# Patient Record
Sex: Male | Born: 1960 | Race: White | Hispanic: No | Marital: Married | Smoking: Former smoker
Health system: Southern US, Community
[De-identification: ages and names within clinical notes are randomized; demographics above are authoritative.]

## PROBLEM LIST (undated history)

## (undated) DIAGNOSIS — M542 Cervicalgia: Secondary | ICD-10-CM

## (undated) DIAGNOSIS — R519 Headache, unspecified: Secondary | ICD-10-CM

## (undated) DIAGNOSIS — I10 Essential (primary) hypertension: Secondary | ICD-10-CM

## (undated) DIAGNOSIS — M109 Gout, unspecified: Secondary | ICD-10-CM

## (undated) DIAGNOSIS — R51 Headache: Secondary | ICD-10-CM

## (undated) DIAGNOSIS — G4730 Sleep apnea, unspecified: Secondary | ICD-10-CM

## (undated) DIAGNOSIS — D689 Coagulation defect, unspecified: Secondary | ICD-10-CM

## (undated) DIAGNOSIS — E079 Disorder of thyroid, unspecified: Secondary | ICD-10-CM

## (undated) DIAGNOSIS — G629 Polyneuropathy, unspecified: Secondary | ICD-10-CM

## (undated) HISTORY — DX: Coagulation defect, unspecified: D68.9

## (undated) HISTORY — DX: Headache, unspecified: R51.9

## (undated) HISTORY — DX: Disorder of thyroid, unspecified: E07.9

## (undated) HISTORY — DX: Cervicalgia: M54.2

## (undated) HISTORY — DX: Essential (primary) hypertension: I10

## (undated) HISTORY — DX: Gout, unspecified: M10.9

## (undated) HISTORY — DX: Polyneuropathy, unspecified: G62.9

## (undated) HISTORY — PX: ABDOMINAL SURGERY: SHX537

## (undated) HISTORY — PX: OTHER SURGICAL HISTORY: SHX169

## (undated) HISTORY — DX: Sleep apnea, unspecified: G47.30

## (undated) HISTORY — DX: Headache: R51

## (undated) HISTORY — PX: HERNIA REPAIR: SHX51

## (undated) HISTORY — PX: KNEE SURGERY: SHX244

---

## 2014-12-26 ENCOUNTER — Ambulatory Visit: Payer: BLUE CROSS/BLUE SHIELD | Admitting: Neurology

## 2015-01-20 ENCOUNTER — Encounter: Payer: Self-pay | Admitting: Neurology

## 2015-01-20 ENCOUNTER — Ambulatory Visit (INDEPENDENT_AMBULATORY_CARE_PROVIDER_SITE_OTHER): Payer: BLUE CROSS/BLUE SHIELD | Admitting: Neurology

## 2015-01-20 VITALS — BP 128/81 | HR 70 | Ht 74.0 in | Wt 276.8 lb

## 2015-01-20 DIAGNOSIS — R202 Paresthesia of skin: Secondary | ICD-10-CM | POA: Diagnosis not present

## 2015-01-20 DIAGNOSIS — M542 Cervicalgia: Secondary | ICD-10-CM

## 2015-01-20 DIAGNOSIS — M25519 Pain in unspecified shoulder: Secondary | ICD-10-CM

## 2015-01-20 DIAGNOSIS — G629 Polyneuropathy, unspecified: Secondary | ICD-10-CM | POA: Diagnosis not present

## 2015-01-20 MED ORDER — CYCLOBENZAPRINE HCL 5 MG PO TABS: 5.0000 mg | ORAL_TABLET | Freq: Three times a day (TID) | ORAL | Status: DC | PRN

## 2015-01-20 MED ORDER — GABAPENTIN 300 MG PO CAPS: 300.0000 mg | ORAL_CAPSULE | Freq: Three times a day (TID) | ORAL | Status: DC

## 2015-01-20 NOTE — Patient Instructions (Signed)
I had a long discussion with the patient and his wife regarding his chronic neck pain and right neck face and shoulder paresthesias as well as diagnosis of peripheral neuropathy. I recommend he try taking Flexeril 5 mg twice daily for one week and then increase to 3 times daily as tolerated for his muscular pain and start gabapentin 300 mg twice daily for one week and increase to 3 times daily as tolerated to help with his paresthesias. Check MRI scan of cervical spine to look for bulging disc and I have advised him to apply local heat to his neck on the right side and not to lift more than 50 pounds weight. We will also try to get his prior neurological records from Dr. Clent Ridges in [place] for his neuropathy workup he was advised to return for follow-up in 2 months or call earlier if necessary  Cervical Sprain A cervical sprain is an injury in the neck in which the strong, fibrous tissues (ligaments) that connect your neck bones stretch or tear. Cervical sprains can range from mild to severe. Severe cervical sprains can cause the neck vertebrae to be unstable. This can lead to damage of the spinal cord and can result in serious nervous system problems. The amount of time it takes for a cervical sprain to get better depends on the cause and extent of the injury. Most cervical sprains heal in 1 to 3 weeks. CAUSES  Severe cervical sprains may be caused by:   Contact sport injuries (such as from football, rugby, wrestling, hockey, auto racing, gymnastics, diving, martial arts, or boxing).   Motor vehicle collisions.   Whiplash injuries. This is an injury from a sudden forward and backward whipping movement of the head and neck.  Falls.  Mild cervical sprains may be caused by:   Being in an awkward position, such as while cradling a telephone between your ear and shoulder.   Sitting in a chair that does not offer proper support.   Working at a poorly Marketing executive station.   Looking up  or down for long periods of time.  SYMPTOMS   Pain, soreness, stiffness, or a burning sensation in the front, back, or sides of the neck. This discomfort may develop immediately after the injury or slowly, 24 hours or more after the injury.   Pain or tenderness directly in the middle of the back of the neck.   Shoulder or upper back pain.   Limited ability to move the neck.   Headache.   Dizziness.   Weakness, numbness, or tingling in the hands or arms.   Muscle spasms.   Difficulty swallowing or chewing.   Tenderness and swelling of the neck.  DIAGNOSIS  Most of the time your health care provider can diagnose a cervical sprain by taking your history and doing a physical exam. Your health care provider will ask about previous neck injuries and any known neck problems, such as arthritis in the neck. X-rays may be taken to find out if there are any other problems, such as with the bones of the neck. Other tests, such as a CT scan or MRI, may also be needed.  TREATMENT  Treatment depends on the severity of the cervical sprain. Mild sprains can be treated with rest, keeping the neck in place (immobilization), and pain medicines. Severe cervical sprains are immediately immobilized. Further treatment is done to help with pain, muscle spasms, and other symptoms and may include:  Medicines, such as pain relievers, numbing medicines, or  muscle relaxants.   Physical therapy. This may involve stretching exercises, strengthening exercises, and posture training. Exercises and improved posture can help stabilize the neck, strengthen muscles, and help stop symptoms from returning.  HOME CARE INSTRUCTIONS   Put ice on the injured area.   Put ice in a plastic bag.   Place a towel between your skin and the bag.   Leave the ice on for 15-20 minutes, 3-4 times a day.   If your injury was severe, you may have been given a cervical collar to wear. A cervical collar is a two-piece  collar designed to keep your neck from moving while it heals.  Do not remove the collar unless instructed by your health care provider.  If you have long hair, keep it outside of the collar.  Ask your health care provider before making any adjustments to your collar. Minor adjustments may be required over time to improve comfort and reduce pressure on your chin or on the back of your head.  Ifyou are allowed to remove the collar for cleaning or bathing, follow your health care provider's instructions on how to do so safely.  Keep your collar clean by wiping it with mild soap and water and drying it completely. If the collar you have been given includes removable pads, remove them every 1-2 days and hand wash them with soap and water. Allow them to air dry. They should be completely dry before you wear them in the collar.  If you are allowed to remove the collar for cleaning and bathing, wash and dry the skin of your neck. Check your skin for irritation or sores. If you see any, tell your health care provider.  Do not drive while wearing the collar.   Only take over-the-counter or prescription medicines for pain, discomfort, or fever as directed by your health care provider.   Keep all follow-up appointments as directed by your health care provider.   Keep all physical therapy appointments as directed by your health care provider.   Make any needed adjustments to your workstation to promote good posture.   Avoid positions and activities that make your symptoms worse.   Warm up and stretch before being active to help prevent problems.  SEEK MEDICAL CARE IF:   Your pain is not controlled with medicine.   You are unable to decrease your pain medicine over time as planned.   Your activity level is not improving as expected.  SEEK IMMEDIATE MEDICAL CARE IF:   You develop any bleeding.  You develop stomach upset.  You have signs of an allergic reaction to your medicine.    Your symptoms get worse.   You develop new, unexplained symptoms.   You have numbness, tingling, weakness, or paralysis in any part of your body.  MAKE SURE YOU:   Understand these instructions.  Will watch your condition.  Will get help right away if you are not doing well or get worse.   This information is not intended to replace advice given to you by your health care provider. Make sure you discuss any questions you have with your health care provider.   Document Released: 01/24/2007 Document Revised: 04/03/2013 Document Reviewed: 10/04/2012 Elsevier Interactive Patient Education [employer]

## 2015-01-20 NOTE — Progress Notes (Signed)
[facility] [address] [place]. [place] [address] [phone]       OFFICE CONSULT NOTE  Mr. Scott Preston Date of Birth:  1960/12/23 Medical Record Number:  [id number]   Referring MD:  Fara Chute  Reason for Referral:  Neck pain and numbness  HPI: Scott Preston is a pleasant 54 year old Caucasian male who is referred for evaluation for bowel symptoms. He states his notice neck pain for the last 1 year or so. It was initially intermittent but for the last 2 months or so he is had constant pain. He describes this as a muscle tightening pain on the right side which seems to be increased with physical exertion and moving his neck to the right or bending it to the right. He was seen by his primary physician Dr. Fara Chute who initially prescribed non-steroidal anti-inflammatory agents with only partial relief. He was also prescribed Flexeril but he took it only at night as it made him sleepy. For the last 2 months is also noticed tingling and numbness sensation involving the right shoulder starting there and spreading to the right infraclavicular region as well as lower part of the neck and at times extending into his right cheek. He denies any sharp shooting pain from his spine going down or into his arms or shoulders. He had some x-rays done of the cervical spine which had not have available for my review but as per the patient showed mild arthritic changes. He has tried local application which have not helped. Currently his been taking Mobic for the last 2 months without help. He has a prescription for Norco but he does not take it as it makes him nauseous and jittery. He has not been on medications like gabapentin or Topamax. Patient does have chronic tingling and numbness in his feet and was seen by neurologist Dr. Shary Decamp in [place] a year ago and had no conduction EMG study which apparently showed neuropathy. I do not have any of those records available for my  review today and patient is not sure if he had neuropathy workup done. He has no history of diabetes, heavy alcohol intake or exposure to toxic agents. 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 He has not limited his heavy lifting despite having significant neck pain and paresthesias. He does have history of bulging disc in his low back a few years ago which was treated conservatively.  ROS:   14 system review of systems is positive for  neck pain, numbness, headache, tingling, joint pain, restless legs and all other systems negative PMH:  Past Medical History  Diagnosis Date  . Neuropathy (HCC)     feet  . Headache   . Neck pain   . Hypertension   . Thyroid disease     Social History:  Social History   Social History  . Marital Status: Married    Spouse Name: N/A  . Number of Children: N/A  . Years of Education: N/A   Occupational History  . Not on file.   Social History Main Topics  . Smoking status: Never Smoker   . Smokeless tobacco: Current User    Types: Chew  . Alcohol Use: Yes     Comment: occasionally beer  . Drug Use: Not on file  . Sexual Activity: Not on file   Other Topics Concern  . Not on file   Social History Narrative  . No narrative on  file    Medications:   No current outpatient prescriptions on file prior to visit.   No current facility-administered medications on file prior to visit.    Allergies:   Allergies  Allergen Reactions  . Penicillins Anaphylaxis    Tongue close up and swelling    Physical Exam General: well developed, well nourished, seated, in no evident distress Head: head normocephalic and atraumatic.   Neck: supple with no carotid or supraclavicular bruits. Tenderness over the lower paraspinal neck muscles on the right with slight restriction of neck flexion to the right and rotation Cardiovascular: regular rate and rhythm, no murmurs Musculoskeletal: no deformity Skin:  no  rash/petichiae Vascular:  Normal pulses all extremities  Neurologic Exam Mental Status: Awake and fully alert. Oriented to place and time. Recent and remote memory intact. Attention span, concentration and fund of knowledge appropriate. Mood and affect appropriate.  Cranial Nerves: Fundoscopic exam reveals sharp disc margins. Pupils equal, briskly reactive to light. Extraocular movements full without nystagmus. Visual fields full to confrontation. Hearing intact. Facial sensation intact. Face, tongue, palate moves normally and symmetrically.  Motor: Normal bulk and tone. Normal strength in all tested extremity muscles. Sensory.: Diminished touch , pinprick sensation over toes bilaterally Diminished vibration sensation over toes bilaterally. Position sense is also slightly impaired over toes bilaterally Coordination: Rapid alternating movements normal in all extremities. Finger-to-nose and heel-to-shin performed accurately bilaterally. Gait and Station: Arises from chair without difficulty. Stance is normal. Gait demonstrates normal stride length and balance . Able to heel, toe and tandem walk without difficulty.  Reflexes: 1+ and symmetric including ankle jerks. Toes downgoing.       ASSESSMENT: 54 year old Caucasian male with one year history of posterior neck pain likely musculoskeletal from degenerative cervical spine disease as well as right face and shoulder paresthesias of unclear etiology. Chronic Lower extremity paresthesias from peripheral neuropathy of uncertain etiology.    PLAN: I had a long discussion with the patient and his wife regarding his chronic neck pain and right neck face and shoulder paresthesias as well as diagnosis of peripheral neuropathy. I recommend he try taking Flexeril 5 mg twice daily for one week and then increase to 3 times daily as tolerated for his muscular pain and start gabapentin 300 mg twice daily for one week and increase to 3 times daily as tolerated to  help with his paresthesias. Check MRI scan of cervical spine to look for bulging disc and I have advised him to apply local heat to his neck on the right side and not to lift more than 50 pounds weight. We will also try to get his prior neurological records from Dr. Clent Ridges in [place] for his neuropathy workup. Greater than 50% time during this 45 minute consultation was spent on counseling and coordination of care he was advised to return for follow-up in 2 months or call earlier if necessary  Delia Heady, MD Note: This document was prepared with digital dictation and possible smart phrase technology. Any transcriptional errors that result from this process are unintentional.

## 2015-01-22 ENCOUNTER — Telehealth: Payer: Self-pay | Admitting: *Deleted

## 2015-01-22 NOTE — Telephone Encounter (Signed)
Received records Dr Pearlean BrownieSethi [facility],Dr Clent RidgesWalsh to [place] 01/22/15.

## 2015-02-12 ENCOUNTER — Other Ambulatory Visit: Payer: Self-pay

## 2015-02-12 ENCOUNTER — Other Ambulatory Visit: Payer: Self-pay | Admitting: *Deleted

## 2015-02-12 ENCOUNTER — Other Ambulatory Visit: Payer: Self-pay | Admitting: Neurology

## 2015-02-12 ENCOUNTER — Ambulatory Visit (INDEPENDENT_AMBULATORY_CARE_PROVIDER_SITE_OTHER): Payer: BLUE CROSS/BLUE SHIELD

## 2015-02-12 ENCOUNTER — Ambulatory Visit
Admission: RE | Admit: 2015-02-12 | Discharge: 2015-02-12 | Disposition: A | Discharge status: Home / Self Care | Payer: BLUE CROSS/BLUE SHIELD | Source: Ambulatory Visit | Attending: Neurology | Admitting: Neurology

## 2015-02-12 DIAGNOSIS — R202 Paresthesia of skin: Secondary | ICD-10-CM

## 2015-02-12 DIAGNOSIS — M549 Dorsalgia, unspecified: Principal | ICD-10-CM

## 2015-02-12 DIAGNOSIS — M25519 Pain in unspecified shoulder: Secondary | ICD-10-CM | POA: Diagnosis not present

## 2015-02-12 DIAGNOSIS — G8929 Other chronic pain: Principal | ICD-10-CM

## 2015-02-12 DIAGNOSIS — M542 Cervicalgia: Secondary | ICD-10-CM

## 2015-02-13 NOTE — Progress Notes (Signed)
Patient schedule to have MRI on 02-12-15. [facility] 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 Sue Lushndrea who does MRi scheduling stating pt needs order. The order was put in by nurse for Dr.Sethi to follow up with.

## 2015-02-18 ENCOUNTER — Other Ambulatory Visit: Payer: Self-pay | Admitting: Neurology

## 2015-02-18 DIAGNOSIS — R202 Paresthesia of skin: Secondary | ICD-10-CM

## 2015-02-18 DIAGNOSIS — M542 Cervicalgia: Principal | ICD-10-CM

## 2015-02-18 DIAGNOSIS — M5010 Cervical disc disorder with radiculopathy, unspecified cervical region: Secondary | ICD-10-CM

## 2015-02-18 DIAGNOSIS — M25519 Pain in unspecified shoulder: Secondary | ICD-10-CM

## 2015-02-18 MED ORDER — GABAPENTIN 300 MG PO CAPS: 600.0000 mg | ORAL_CAPSULE | Freq: Three times a day (TID) | ORAL | Status: DC

## 2015-03-20 ENCOUNTER — Encounter: Payer: Self-pay | Admitting: Neurology

## 2015-03-20 ENCOUNTER — Ambulatory Visit (INDEPENDENT_AMBULATORY_CARE_PROVIDER_SITE_OTHER): Payer: BLUE CROSS/BLUE SHIELD | Admitting: Neurology

## 2015-03-20 VITALS — BP 136/82 | HR 77 | Ht 74.0 in | Wt 283.2 lb

## 2015-03-20 DIAGNOSIS — M542 Cervicalgia: Secondary | ICD-10-CM | POA: Diagnosis not present

## 2015-03-20 NOTE — Progress Notes (Signed)
[facility] [address] [place]. [place] [address] [phone]       OFFICE FOLLOW UP VISIT NOTE  Mr. Scott Preston Date of Birth:  30-May-1960 Medical Record Number:  [id number]   Referring MD:  Fara Chute  Reason for Referral:  Neck pain and numbness  HPI: Scott Preston is a pleasant 54 year old Caucasian male who is referred for evaluation for bowel symptoms. He states his notice neck pain for the last 1 year or so. It was initially intermittent but for the last 2 months or so he is had constant pain. He describes this as a muscle tightening pain on the right side which seems to be increased with physical exertion and moving his neck to the right or bending it to the right. He was seen by his primary physician Dr. Fara Chute who initially prescribed non-steroidal anti-inflammatory agents with only partial relief. He was also prescribed Flexeril but he took it only at night as it made him sleepy. For the last 2 months is also noticed tingling and numbness sensation involving the right shoulder starting there and spreading to the right infraclavicular region as well as lower part of the neck and at times extending into his right cheek. He denies any sharp shooting pain from his spine going down or into his arms or shoulders. He had some x-rays done of the cervical spine which had not have available for my review but as per the patient showed mild arthritic changes. He has tried local application which have not helped. Currently his been taking Mobic for the last 2 months without help. He has a prescription for Norco but he does not take it as it makes him nauseous and jittery. He has not been on medications like gabapentin or Topamax. Patient does have chronic tingling and numbness in his feet and was seen by neurologist Dr. Shary Decamp in [place] a year ago and had no conduction EMG study which apparently showed neuropathy. I do not have any of those records available  for my review today and patient is not sure if he had neuropathy workup done. He has no history of diabetes, heavy alcohol intake or exposure to toxic agents. 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 He has not limited his heavy lifting despite having significant neck pain and paresthesias. He does have history of bulging disc in his low back a few years ago which was treated conservatively. Update 03/20/2015 ; He returns for follow-up after last visit 2 months ago. His history of increasing the gabapentin to 600 mg twice daily does not seem to help in fact regarding side effects and feels his gained weight and has swelling in his legs. He is also taking Flexeril 5 mg twice daily but that doesn't seem to be helped either but is tolerating it well without side effects. He still has pain on the right side of the neck and other times when he has trouble moving his neck to the right side. He describes mostly a muscle pulling tightness pain but at times he also has numbness on the right side. He did undergo MRI scan of the cervical spine which I have personally reviewed on 02/14/15 which shows slight anterior listhesis of C3 upon C4 with moderate uncovertebral spurring and broad-based disc bulging with neural foraminal narrowing right greater than left with possible right C4 nerve root compression. I have referred him to Dr. Venetia Maxon but the appointment has not  yet happened. ROS:   14 system review of systems is positive for  neck pain, numbness, cough, leg swelling tingling, joint pain, restless legs and all other systems negative PMH:  Past Medical History  Diagnosis Date  . Neuropathy (HCC)     feet  . Headache   . Neck pain   . Hypertension   . Thyroid disease     Social History:  Social History   Social History  . Marital Status: Married    Spouse Name: N/A  . Number of Children: N/A  . Years of Education: N/A   Occupational History  . Not on file.    Social History Main Topics  . Smoking status: Never Smoker   . Smokeless tobacco: Current User    Types: Chew  . Alcohol Use: Yes     Comment: occasionally beer  . Drug Use: Not on file  . Sexual Activity: Not on file   Other Topics Concern  . Not on file   Social History Narrative    Medications:   Current Outpatient Prescriptions on File Prior to Visit  Medication Sig Dispense Refill  . aspirin 81 MG tablet Take 81 mg by mouth daily.    . cyclobenzaprine (FLEXERIL) 5 MG tablet Take 1 tablet (5 mg total) by mouth every 8 (eight) hours as needed for muscle spasms. 30 tablet 1  . gabapentin (NEURONTIN) 300 MG capsule Take 2 capsules (600 mg total) by mouth 3 (three) times daily. 90 capsule 11  . levothyroxine (SYNTHROID, LEVOTHROID) 125 MCG tablet     . lisinopril (PRINIVIL,ZESTRIL) 20 MG tablet Take 20 mg by mouth daily.    . traZODone (DESYREL) 100 MG tablet      No current facility-administered medications on file prior to visit.    Allergies:   Allergies  Allergen Reactions  . Penicillins Anaphylaxis    Tongue close up and swelling    Physical Exam General: well developed, well nourished, seated, in no evident distress Head: head normocephalic and atraumatic.   Neck: supple with no carotid or supraclavicular bruits. Tenderness over the lower paraspinal neck muscles on the right with slight restriction of neck flexion to the right and rotation Cardiovascular: regular rate and rhythm, no murmurs Musculoskeletal: no deformity Skin:  no rash/petichiae Vascular:  Normal pulses all extremities  Neurologic Exam Mental Status: Awake and fully alert. Oriented to place and time. Recent and remote memory intact. Attention span, concentration and fund of knowledge appropriate. Mood and affect appropriate.  Cranial Nerves: Fundoscopic exam  c  . Pupils equal, briskly reactive to light. Extraocular movements full without nystagmus. Visual fields full to confrontation. Hearing  intact. Facial sensation intact. Face, tongue, palate moves normally and symmetrically.  Motor: Normal bulk and tone. Normal strength in all tested extremity muscles. Sensory.: Diminished touch , pinprick sensation over toes bilaterally Diminished vibration sensation over toes bilaterally. Position sense is also slightly impaired over toes bilaterally Coordination: Rapid alternating movements normal in all extremities. Finger-to-nose and heel-to-shin performed accurately bilaterally. Gait and Station: Arises from chair without difficulty. Stance is normal. Gait demonstrates normal stride length and balance . Able to heel, toe and tandem walk without difficulty.  Reflexes: 1+ and symmetric including ankle jerks. Toes downgoing.       ASSESSMENT: 54 year old Caucasian male with one year history of posterior neck pain likely musculoskeletal from degenerative cervical spine disease as well as  Possible right C 4 radiculopathy. Chronic Lower extremity paresthesias from peripheral neuropathy of uncertain etiology.  PLAN: I had a long discussion the patient and his wife regarding his persistent right sided posterior neck and shoulder pain which has mixed musculoskeletal and C4 radicular pain features. He has not responded to gabapentin and is having side effects and hence recommend we discontinue it he has a referral to see Dr. Venetia MaxonStern from neurosurgery to consider steroid injection versus foraminotomy. I have given him neck stretching exercises and advised him to use local heat and to try increasing Flexeril to 10 mg twice daily if tolerated. He will call me face neuropathic symptoms from peripheral neuropathy get worse after stopping the gabapentin to try Topamax for that he, later. He will return in follow-up in 6 months or call earlier if necessary  Scott HeadyPramod Sethi, MD Note: This document was prepared with digital dictation and possible smart phrase technology. Any transcriptional errors that result  from this process are unintentional.

## 2015-03-20 NOTE — Patient Instructions (Signed)
I had a long discussion the patient and his wife regarding his persistent right sided posterior neck and shoulder pain which has mixed musculoskeletal and C4 radicular pain features. He has not responded to gabapentin and is having side effects and hence recommend we discontinue it he has a referral to see Dr. Venetia MaxonStern from neurosurgery to consider steroid injection versus foraminotomy. I have given him neck stretching exercises and advised him to use local heat and to try increasing Flexeril to 10 mg twice daily if tolerated. He will call me face neuropathic symptoms from peripheral neuropathy get worse after stopping the gabapentin to try Topamax for that he, later. He will return in follow-up in 6 months or call earlier if necessary Cervical Radiculopathy Cervical radiculopathy happens when a nerve in the neck (cervical nerve) is pinched or bruised. This condition can develop because of an injury or as part of the normal aging process. Pressure on the cervical nerves can cause pain or numbness that runs from the neck all the way down into the arm and fingers. Usually, this condition gets better with rest. Treatment may be needed if the condition does not improve.  CAUSES This condition may be caused by:  Injury.  Slipped (herniated) disk.  Muscle tightness in the neck because of overuse.  Arthritis.  Breakdown or degeneration in the bones and joints of the spine (spondylosis) due to aging.  Bone spurs that may develop near the cervical nerves. SYMPTOMS Symptoms of this condition include:  Pain that runs from the neck to the arm and hand. The pain can be severe or irritating. It may be worse when the neck is moved.  Numbness or weakness in the affected arm and hand. DIAGNOSIS This condition may be diagnosed based on symptoms, medical history, and a physical exam. You may also have tests, including:  X-rays.  CT scan.  MRI.  Electromyogram (EMG).  Nerve conduction  tests. TREATMENT In many cases, treatment is not needed for this condition. With rest, the condition usually gets better over time. If treatment is needed, options may include:  Wearing a soft neck collar for short periods of time.  Physical therapy to strengthen your neck muscles.  Medicines, such as NSAIDs, oral corticosteroids, or spinal injections.  Surgery. This may be needed if other treatments do not help. Various types of surgery may be done depending on the cause of your problems. HOME CARE INSTRUCTIONS Managing Pain  Take over-the-counter and prescription medicines only as told by your health care provider.  If directed, apply ice to the affected area.  Put ice in a plastic bag.  Place a towel between your skin and the bag.  Leave the ice on for 20 minutes, 2-3 times per day.  If ice does not help, you can try using heat. Take a warm shower or warm bath, or use a heat pack as told by your health care provider.  Try a gentle neck and shoulder massage to help relieve symptoms. Activity  Rest as needed. Follow instructions from your health care provider about any restrictions on activities.  Do stretching and strengthening exercises as told by your health care provider or physical therapist. General Instructions  If you were given a soft collar, wear it as told by your health care provider.  Use a flat pillow when you sleep.  Keep all follow-up visits as told by your health care provider. This is important. SEEK MEDICAL CARE IF:  Your condition does not improve with treatment. SEEK IMMEDIATE MEDICAL  CARE IF:  Your pain gets much worse and cannot be controlled with medicines.  You have weakness or numbness in your hand, arm, face, or leg.  You have a high fever.  You have a stiff, rigid neck.  You lose control of your bowels or your bladder (have incontinence).  You have trouble with walking, balance, or speaking.   This information is not intended to  replace advice given to you by your health care provider. Make sure you discuss any questions you have with your health care provider.   Document Released: 12/22/2000 Document Revised: 12/18/2014 Document Reviewed: 05/23/2014 Elsevier Interactive Patient Education [employer]

## 2015-03-26 ENCOUNTER — Other Ambulatory Visit: Payer: Self-pay | Admitting: Neurology

## 2015-03-26 NOTE — Telephone Encounter (Signed)
Last OV note says: try increasing Flexeril to 10 mg twice daily if tolerated

## 2015-04-13 HISTORY — PX: SPINAL FUSION: SHX223

## 2015-09-18 ENCOUNTER — Ambulatory Visit: Payer: BLUE CROSS/BLUE SHIELD | Admitting: Neurology

## 2015-10-23 DIAGNOSIS — R739 Hyperglycemia, unspecified: Secondary | ICD-10-CM | POA: Diagnosis not present

## 2015-10-23 DIAGNOSIS — I10 Essential (primary) hypertension: Secondary | ICD-10-CM | POA: Diagnosis not present

## 2015-10-23 DIAGNOSIS — E039 Hypothyroidism, unspecified: Secondary | ICD-10-CM | POA: Diagnosis not present

## 2016-03-13 IMAGING — CR DG ORBITS FOR FOREIGN BODY
2 series · 2 of 2 positions shown · non-contrast
Comparison: None.

CLINICAL DATA: Metal working/exposure; clearance prior to MRI

EXAM:
ORBITS FOR FOREIGN BODY - 2 VIEW

[w orbit pa (1 of 2)]
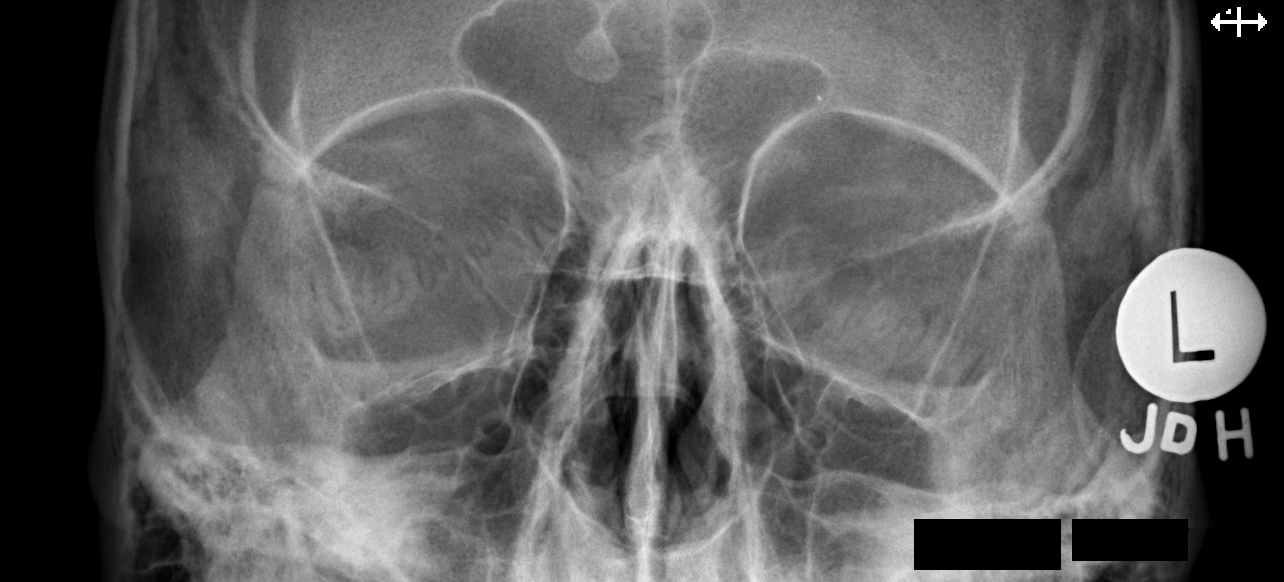

[w orbit pa (2 of 2)]
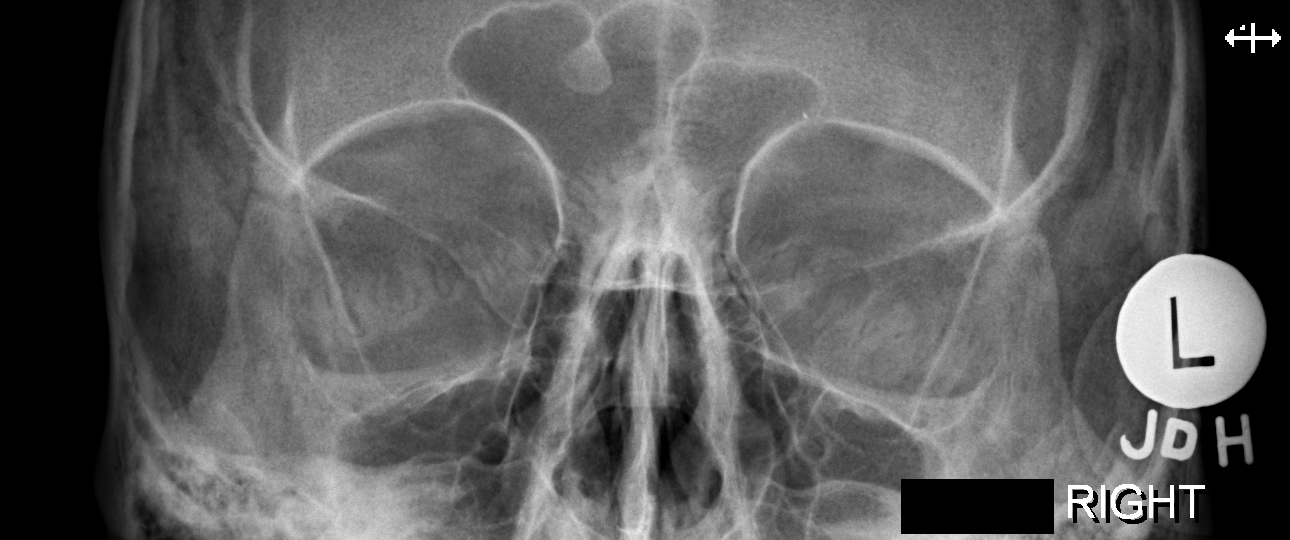

[2 of 2 positions shown; findings below may reference images not displayed]

FINDINGS: There is no evidence of metallic foreign body within the orbits. No
significant bone abnormality identified.
IMPRESSION: No evidence of metallic foreign body within the orbits.

## 2016-05-03 DIAGNOSIS — I10 Essential (primary) hypertension: Secondary | ICD-10-CM | POA: Diagnosis not present

## 2016-05-03 DIAGNOSIS — E039 Hypothyroidism, unspecified: Secondary | ICD-10-CM | POA: Diagnosis not present

## 2016-05-03 DIAGNOSIS — M545 Low back pain: Secondary | ICD-10-CM | POA: Diagnosis not present

## 2016-05-03 DIAGNOSIS — M2578 Osteophyte, vertebrae: Secondary | ICD-10-CM | POA: Diagnosis not present

## 2016-05-03 DIAGNOSIS — M542 Cervicalgia: Secondary | ICD-10-CM | POA: Diagnosis not present

## 2016-05-25 DIAGNOSIS — J209 Acute bronchitis, unspecified: Secondary | ICD-10-CM | POA: Diagnosis not present

## 2016-05-25 DIAGNOSIS — Z6841 Body Mass Index (BMI) 40.0 and over, adult: Secondary | ICD-10-CM | POA: Diagnosis not present

## 2016-05-25 DIAGNOSIS — J069 Acute upper respiratory infection, unspecified: Secondary | ICD-10-CM | POA: Diagnosis not present

## 2016-07-29 DIAGNOSIS — M19011 Primary osteoarthritis, right shoulder: Secondary | ICD-10-CM | POA: Diagnosis not present

## 2016-07-29 DIAGNOSIS — M542 Cervicalgia: Secondary | ICD-10-CM | POA: Diagnosis not present

## 2016-07-29 DIAGNOSIS — M19012 Primary osteoarthritis, left shoulder: Secondary | ICD-10-CM | POA: Diagnosis not present

## 2016-07-29 DIAGNOSIS — I82401 Acute embolism and thrombosis of unspecified deep veins of right lower extremity: Secondary | ICD-10-CM | POA: Diagnosis not present

## 2016-07-29 DIAGNOSIS — M799 Soft tissue disorder, unspecified: Secondary | ICD-10-CM | POA: Diagnosis not present

## 2016-07-29 DIAGNOSIS — I10 Essential (primary) hypertension: Secondary | ICD-10-CM | POA: Diagnosis not present

## 2016-07-29 DIAGNOSIS — E039 Hypothyroidism, unspecified: Secondary | ICD-10-CM | POA: Diagnosis not present

## 2016-07-29 DIAGNOSIS — Z23 Encounter for immunization: Secondary | ICD-10-CM | POA: Diagnosis not present

## 2016-07-29 DIAGNOSIS — Z13220 Encounter for screening for lipoid disorders: Secondary | ICD-10-CM | POA: Diagnosis not present

## 2016-09-08 DIAGNOSIS — R072 Precordial pain: Secondary | ICD-10-CM | POA: Diagnosis not present

## 2016-09-08 DIAGNOSIS — R0600 Dyspnea, unspecified: Secondary | ICD-10-CM | POA: Diagnosis not present

## 2016-09-08 DIAGNOSIS — I824Z1 Acute embolism and thrombosis of unspecified deep veins of right distal lower extremity: Secondary | ICD-10-CM | POA: Diagnosis not present

## 2016-09-08 DIAGNOSIS — M722 Plantar fascial fibromatosis: Secondary | ICD-10-CM | POA: Diagnosis not present

## 2016-09-09 DIAGNOSIS — R0602 Shortness of breath: Secondary | ICD-10-CM | POA: Diagnosis not present

## 2016-09-09 DIAGNOSIS — R079 Chest pain, unspecified: Secondary | ICD-10-CM | POA: Diagnosis not present

## 2016-10-14 DIAGNOSIS — I824Z1 Acute embolism and thrombosis of unspecified deep veins of right distal lower extremity: Secondary | ICD-10-CM | POA: Diagnosis not present

## 2016-10-14 DIAGNOSIS — Z6841 Body Mass Index (BMI) 40.0 and over, adult: Secondary | ICD-10-CM | POA: Diagnosis not present

## 2016-10-18 DIAGNOSIS — M722 Plantar fascial fibromatosis: Secondary | ICD-10-CM | POA: Diagnosis not present

## 2016-11-29 DIAGNOSIS — M545 Low back pain: Secondary | ICD-10-CM | POA: Diagnosis not present

## 2016-11-29 DIAGNOSIS — M542 Cervicalgia: Secondary | ICD-10-CM | POA: Diagnosis not present

## 2016-11-29 DIAGNOSIS — I10 Essential (primary) hypertension: Secondary | ICD-10-CM | POA: Diagnosis not present

## 2016-11-29 DIAGNOSIS — E039 Hypothyroidism, unspecified: Secondary | ICD-10-CM | POA: Diagnosis not present

## 2016-11-29 DIAGNOSIS — Z23 Encounter for immunization: Secondary | ICD-10-CM | POA: Diagnosis not present

## 2016-12-17 DIAGNOSIS — M19011 Primary osteoarthritis, right shoulder: Secondary | ICD-10-CM | POA: Diagnosis not present

## 2016-12-17 DIAGNOSIS — Z1159 Encounter for screening for other viral diseases: Secondary | ICD-10-CM | POA: Diagnosis not present

## 2016-12-17 DIAGNOSIS — M75101 Unspecified rotator cuff tear or rupture of right shoulder, not specified as traumatic: Secondary | ICD-10-CM | POA: Diagnosis not present

## 2016-12-17 DIAGNOSIS — M7551 Bursitis of right shoulder: Secondary | ICD-10-CM | POA: Diagnosis not present

## 2016-12-17 DIAGNOSIS — M795 Residual foreign body in soft tissue: Secondary | ICD-10-CM | POA: Diagnosis not present

## 2017-01-04 DIAGNOSIS — M722 Plantar fascial fibromatosis: Secondary | ICD-10-CM | POA: Diagnosis not present

## 2017-01-04 DIAGNOSIS — M545 Low back pain: Secondary | ICD-10-CM | POA: Diagnosis not present

## 2017-01-04 DIAGNOSIS — E669 Obesity, unspecified: Secondary | ICD-10-CM | POA: Diagnosis not present

## 2017-01-04 DIAGNOSIS — G629 Polyneuropathy, unspecified: Secondary | ICD-10-CM | POA: Diagnosis not present

## 2017-01-20 DIAGNOSIS — I82491 Acute embolism and thrombosis of other specified deep vein of right lower extremity: Secondary | ICD-10-CM | POA: Diagnosis not present

## 2017-01-20 DIAGNOSIS — F17220 Nicotine dependence, chewing tobacco, uncomplicated: Secondary | ICD-10-CM | POA: Diagnosis not present

## 2017-01-20 DIAGNOSIS — I10 Essential (primary) hypertension: Secondary | ICD-10-CM | POA: Diagnosis not present

## 2017-01-20 DIAGNOSIS — I82442 Acute embolism and thrombosis of left tibial vein: Secondary | ICD-10-CM | POA: Diagnosis not present

## 2017-01-20 DIAGNOSIS — M7989 Other specified soft tissue disorders: Secondary | ICD-10-CM | POA: Diagnosis not present

## 2017-01-20 DIAGNOSIS — R0600 Dyspnea, unspecified: Secondary | ICD-10-CM | POA: Diagnosis not present

## 2017-01-20 DIAGNOSIS — I2699 Other pulmonary embolism without acute cor pulmonale: Secondary | ICD-10-CM | POA: Diagnosis not present

## 2017-01-21 DIAGNOSIS — F17220 Nicotine dependence, chewing tobacco, uncomplicated: Secondary | ICD-10-CM | POA: Diagnosis not present

## 2017-01-21 DIAGNOSIS — I10 Essential (primary) hypertension: Secondary | ICD-10-CM | POA: Diagnosis not present

## 2017-01-21 DIAGNOSIS — I2699 Other pulmonary embolism without acute cor pulmonale: Secondary | ICD-10-CM | POA: Diagnosis not present

## 2017-02-01 DIAGNOSIS — I10 Essential (primary) hypertension: Secondary | ICD-10-CM | POA: Diagnosis not present

## 2017-02-01 DIAGNOSIS — E039 Hypothyroidism, unspecified: Secondary | ICD-10-CM | POA: Diagnosis not present

## 2017-02-01 DIAGNOSIS — Z981 Arthrodesis status: Secondary | ICD-10-CM | POA: Diagnosis not present

## 2017-02-01 DIAGNOSIS — G629 Polyneuropathy, unspecified: Secondary | ICD-10-CM | POA: Diagnosis not present

## 2017-02-01 DIAGNOSIS — G8929 Other chronic pain: Secondary | ICD-10-CM | POA: Diagnosis not present

## 2017-02-01 DIAGNOSIS — M545 Low back pain: Secondary | ICD-10-CM | POA: Diagnosis not present

## 2017-02-01 DIAGNOSIS — I2699 Other pulmonary embolism without acute cor pulmonale: Secondary | ICD-10-CM | POA: Diagnosis not present

## 2017-02-01 DIAGNOSIS — M19011 Primary osteoarthritis, right shoulder: Secondary | ICD-10-CM | POA: Diagnosis not present

## 2017-02-01 DIAGNOSIS — R202 Paresthesia of skin: Secondary | ICD-10-CM | POA: Diagnosis not present

## 2017-02-01 DIAGNOSIS — M25511 Pain in right shoulder: Secondary | ICD-10-CM | POA: Diagnosis not present

## 2017-02-01 DIAGNOSIS — E668 Other obesity: Secondary | ICD-10-CM | POA: Diagnosis not present

## 2017-02-01 DIAGNOSIS — G40909 Epilepsy, unspecified, not intractable, without status epilepticus: Secondary | ICD-10-CM | POA: Diagnosis not present

## 2017-02-01 DIAGNOSIS — Z6841 Body Mass Index (BMI) 40.0 and over, adult: Secondary | ICD-10-CM | POA: Diagnosis not present

## 2017-02-01 DIAGNOSIS — E669 Obesity, unspecified: Secondary | ICD-10-CM | POA: Diagnosis not present

## 2017-02-01 DIAGNOSIS — M5412 Radiculopathy, cervical region: Secondary | ICD-10-CM | POA: Diagnosis not present

## 2017-02-01 DIAGNOSIS — M542 Cervicalgia: Secondary | ICD-10-CM | POA: Diagnosis not present

## 2017-02-01 DIAGNOSIS — I82541 Chronic embolism and thrombosis of right tibial vein: Secondary | ICD-10-CM | POA: Diagnosis not present

## 2017-02-09 DIAGNOSIS — N281 Cyst of kidney, acquired: Secondary | ICD-10-CM | POA: Diagnosis not present

## 2017-02-16 DIAGNOSIS — O2230 Deep phlebothrombosis in pregnancy, unspecified trimester: Secondary | ICD-10-CM | POA: Diagnosis not present

## 2017-02-16 DIAGNOSIS — Z6841 Body Mass Index (BMI) 40.0 and over, adult: Secondary | ICD-10-CM | POA: Diagnosis not present

## 2017-02-16 DIAGNOSIS — I82409 Acute embolism and thrombosis of unspecified deep veins of unspecified lower extremity: Secondary | ICD-10-CM | POA: Diagnosis not present

## 2017-02-16 DIAGNOSIS — I2609 Other pulmonary embolism with acute cor pulmonale: Secondary | ICD-10-CM | POA: Diagnosis not present

## 2017-02-18 DIAGNOSIS — R202 Paresthesia of skin: Secondary | ICD-10-CM | POA: Diagnosis not present

## 2017-02-18 DIAGNOSIS — I2609 Other pulmonary embolism with acute cor pulmonale: Secondary | ICD-10-CM | POA: Diagnosis not present

## 2017-02-18 DIAGNOSIS — M542 Cervicalgia: Secondary | ICD-10-CM | POA: Diagnosis not present

## 2017-02-18 DIAGNOSIS — I82409 Acute embolism and thrombosis of unspecified deep veins of unspecified lower extremity: Secondary | ICD-10-CM | POA: Diagnosis not present

## 2017-02-18 DIAGNOSIS — M25519 Pain in unspecified shoulder: Secondary | ICD-10-CM | POA: Diagnosis not present

## 2017-02-18 DIAGNOSIS — G629 Polyneuropathy, unspecified: Secondary | ICD-10-CM | POA: Diagnosis not present

## 2017-02-20 DIAGNOSIS — Z6841 Body Mass Index (BMI) 40.0 and over, adult: Secondary | ICD-10-CM | POA: Diagnosis not present

## 2017-02-20 DIAGNOSIS — I2699 Other pulmonary embolism without acute cor pulmonale: Secondary | ICD-10-CM | POA: Diagnosis not present

## 2017-02-20 DIAGNOSIS — I824Z1 Acute embolism and thrombosis of unspecified deep veins of right distal lower extremity: Secondary | ICD-10-CM | POA: Diagnosis not present

## 2017-03-22 DIAGNOSIS — Z1389 Encounter for screening for other disorder: Secondary | ICD-10-CM | POA: Diagnosis not present

## 2017-03-22 DIAGNOSIS — I10 Essential (primary) hypertension: Secondary | ICD-10-CM | POA: Diagnosis not present

## 2017-03-22 DIAGNOSIS — I824Z1 Acute embolism and thrombosis of unspecified deep veins of right distal lower extremity: Secondary | ICD-10-CM | POA: Diagnosis not present

## 2017-03-22 DIAGNOSIS — G2581 Restless legs syndrome: Secondary | ICD-10-CM | POA: Diagnosis not present

## 2017-03-22 DIAGNOSIS — E039 Hypothyroidism, unspecified: Secondary | ICD-10-CM | POA: Diagnosis not present

## 2017-04-03 DIAGNOSIS — E669 Obesity, unspecified: Secondary | ICD-10-CM | POA: Diagnosis not present

## 2017-04-03 DIAGNOSIS — E039 Hypothyroidism, unspecified: Secondary | ICD-10-CM | POA: Diagnosis not present

## 2017-04-03 DIAGNOSIS — R05 Cough: Secondary | ICD-10-CM | POA: Diagnosis not present

## 2017-04-03 DIAGNOSIS — I10 Essential (primary) hypertension: Secondary | ICD-10-CM | POA: Diagnosis not present

## 2017-04-03 DIAGNOSIS — J189 Pneumonia, unspecified organism: Secondary | ICD-10-CM | POA: Diagnosis not present

## 2017-04-04 DIAGNOSIS — J189 Pneumonia, unspecified organism: Secondary | ICD-10-CM | POA: Diagnosis not present

## 2017-04-04 DIAGNOSIS — E039 Hypothyroidism, unspecified: Secondary | ICD-10-CM | POA: Diagnosis not present

## 2017-04-04 DIAGNOSIS — I10 Essential (primary) hypertension: Secondary | ICD-10-CM | POA: Diagnosis not present

## 2017-04-04 DIAGNOSIS — E669 Obesity, unspecified: Secondary | ICD-10-CM | POA: Diagnosis not present

## 2017-04-05 DIAGNOSIS — J189 Pneumonia, unspecified organism: Secondary | ICD-10-CM | POA: Diagnosis not present

## 2017-04-05 DIAGNOSIS — J40 Bronchitis, not specified as acute or chronic: Secondary | ICD-10-CM | POA: Diagnosis not present

## 2017-04-05 DIAGNOSIS — I10 Essential (primary) hypertension: Secondary | ICD-10-CM | POA: Diagnosis not present

## 2017-04-05 DIAGNOSIS — E039 Hypothyroidism, unspecified: Secondary | ICD-10-CM | POA: Diagnosis not present

## 2017-04-09 DIAGNOSIS — R05 Cough: Secondary | ICD-10-CM | POA: Diagnosis not present

## 2017-04-09 DIAGNOSIS — M25474 Effusion, right foot: Secondary | ICD-10-CM | POA: Diagnosis not present

## 2017-04-09 DIAGNOSIS — R079 Chest pain, unspecified: Secondary | ICD-10-CM | POA: Diagnosis not present

## 2017-04-09 DIAGNOSIS — M79671 Pain in right foot: Secondary | ICD-10-CM | POA: Diagnosis not present

## 2017-04-09 DIAGNOSIS — R0602 Shortness of breath: Secondary | ICD-10-CM | POA: Diagnosis not present

## 2017-04-09 DIAGNOSIS — Z7901 Long term (current) use of anticoagulants: Secondary | ICD-10-CM | POA: Diagnosis not present

## 2017-04-16 DIAGNOSIS — J189 Pneumonia, unspecified organism: Secondary | ICD-10-CM | POA: Diagnosis not present

## 2017-04-16 DIAGNOSIS — Z6841 Body Mass Index (BMI) 40.0 and over, adult: Secondary | ICD-10-CM | POA: Diagnosis not present

## 2017-04-16 DIAGNOSIS — M25571 Pain in right ankle and joints of right foot: Secondary | ICD-10-CM | POA: Diagnosis not present

## 2017-05-05 DIAGNOSIS — M25571 Pain in right ankle and joints of right foot: Secondary | ICD-10-CM | POA: Diagnosis not present

## 2017-05-05 DIAGNOSIS — J189 Pneumonia, unspecified organism: Secondary | ICD-10-CM | POA: Diagnosis not present

## 2017-05-05 DIAGNOSIS — Z6841 Body Mass Index (BMI) 40.0 and over, adult: Secondary | ICD-10-CM | POA: Diagnosis not present

## 2017-05-17 DIAGNOSIS — G629 Polyneuropathy, unspecified: Secondary | ICD-10-CM | POA: Diagnosis not present

## 2017-05-17 DIAGNOSIS — J189 Pneumonia, unspecified organism: Secondary | ICD-10-CM | POA: Diagnosis not present

## 2017-05-17 DIAGNOSIS — Z6841 Body Mass Index (BMI) 40.0 and over, adult: Secondary | ICD-10-CM | POA: Diagnosis not present

## 2017-05-17 DIAGNOSIS — D509 Iron deficiency anemia, unspecified: Secondary | ICD-10-CM | POA: Diagnosis not present

## 2017-06-01 DIAGNOSIS — J069 Acute upper respiratory infection, unspecified: Secondary | ICD-10-CM | POA: Diagnosis not present

## 2017-06-01 DIAGNOSIS — Z86718 Personal history of other venous thrombosis and embolism: Secondary | ICD-10-CM | POA: Diagnosis not present

## 2017-06-01 DIAGNOSIS — E039 Hypothyroidism, unspecified: Secondary | ICD-10-CM | POA: Diagnosis not present

## 2017-06-01 DIAGNOSIS — I10 Essential (primary) hypertension: Secondary | ICD-10-CM | POA: Diagnosis not present

## 2017-06-01 DIAGNOSIS — I82813 Embolism and thrombosis of superficial veins of lower extremities, bilateral: Secondary | ICD-10-CM | POA: Diagnosis not present

## 2017-06-15 DIAGNOSIS — Z86711 Personal history of pulmonary embolism: Secondary | ICD-10-CM | POA: Diagnosis not present

## 2017-06-15 DIAGNOSIS — J189 Pneumonia, unspecified organism: Secondary | ICD-10-CM | POA: Diagnosis not present

## 2017-06-15 DIAGNOSIS — Z7901 Long term (current) use of anticoagulants: Secondary | ICD-10-CM | POA: Diagnosis not present

## 2017-06-15 DIAGNOSIS — Z5181 Encounter for therapeutic drug level monitoring: Secondary | ICD-10-CM | POA: Diagnosis not present

## 2017-09-02 DIAGNOSIS — S31109A Unspecified open wound of abdominal wall, unspecified quadrant without penetration into peritoneal cavity, initial encounter: Secondary | ICD-10-CM | POA: Diagnosis not present

## 2017-09-02 DIAGNOSIS — G629 Polyneuropathy, unspecified: Secondary | ICD-10-CM | POA: Diagnosis not present

## 2017-09-02 DIAGNOSIS — L98491 Non-pressure chronic ulcer of skin of other sites limited to breakdown of skin: Secondary | ICD-10-CM | POA: Diagnosis not present

## 2017-09-02 DIAGNOSIS — Z7901 Long term (current) use of anticoagulants: Secondary | ICD-10-CM | POA: Diagnosis not present

## 2017-09-02 DIAGNOSIS — R5381 Other malaise: Secondary | ICD-10-CM | POA: Diagnosis not present

## 2017-09-02 DIAGNOSIS — S2232XD Fracture of one rib, left side, subsequent encounter for fracture with routine healing: Secondary | ICD-10-CM | POA: Diagnosis not present

## 2017-09-02 DIAGNOSIS — M792 Neuralgia and neuritis, unspecified: Secondary | ICD-10-CM | POA: Diagnosis not present

## 2017-09-02 DIAGNOSIS — K5900 Constipation, unspecified: Secondary | ICD-10-CM | POA: Diagnosis not present

## 2017-09-02 DIAGNOSIS — R531 Weakness: Secondary | ICD-10-CM | POA: Diagnosis not present

## 2017-09-02 DIAGNOSIS — R29818 Other symptoms and signs involving the nervous system: Secondary | ICD-10-CM | POA: Diagnosis not present

## 2017-09-02 DIAGNOSIS — E039 Hypothyroidism, unspecified: Secondary | ICD-10-CM | POA: Diagnosis not present

## 2017-09-02 DIAGNOSIS — Z5189 Encounter for other specified aftercare: Secondary | ICD-10-CM | POA: Diagnosis not present

## 2017-09-02 DIAGNOSIS — R4189 Other symptoms and signs involving cognitive functions and awareness: Secondary | ICD-10-CM | POA: Diagnosis not present

## 2017-09-02 DIAGNOSIS — R600 Localized edema: Secondary | ICD-10-CM | POA: Diagnosis not present

## 2017-09-02 DIAGNOSIS — W230XXD Caught, crushed, jammed, or pinched between moving objects, subsequent encounter: Secondary | ICD-10-CM | POA: Diagnosis not present

## 2017-09-02 DIAGNOSIS — G4700 Insomnia, unspecified: Secondary | ICD-10-CM | POA: Diagnosis not present

## 2017-09-02 DIAGNOSIS — I82509 Chronic embolism and thrombosis of unspecified deep veins of unspecified lower extremity: Secondary | ICD-10-CM | POA: Diagnosis not present

## 2017-09-02 DIAGNOSIS — R448 Other symptoms and signs involving general sensations and perceptions: Secondary | ICD-10-CM | POA: Diagnosis not present

## 2017-09-02 DIAGNOSIS — S301XXD Contusion of abdominal wall, subsequent encounter: Secondary | ICD-10-CM | POA: Diagnosis not present

## 2017-09-02 DIAGNOSIS — E669 Obesity, unspecified: Secondary | ICD-10-CM | POA: Diagnosis not present

## 2017-09-03 DIAGNOSIS — E039 Hypothyroidism, unspecified: Secondary | ICD-10-CM | POA: Diagnosis not present

## 2017-09-03 DIAGNOSIS — K5900 Constipation, unspecified: Secondary | ICD-10-CM | POA: Diagnosis not present

## 2017-09-03 DIAGNOSIS — R29818 Other symptoms and signs involving the nervous system: Secondary | ICD-10-CM | POA: Diagnosis not present

## 2017-09-03 DIAGNOSIS — R531 Weakness: Secondary | ICD-10-CM | POA: Diagnosis not present

## 2017-09-05 DIAGNOSIS — R531 Weakness: Secondary | ICD-10-CM | POA: Diagnosis not present

## 2017-09-05 DIAGNOSIS — K5900 Constipation, unspecified: Secondary | ICD-10-CM | POA: Diagnosis not present

## 2017-09-05 DIAGNOSIS — E039 Hypothyroidism, unspecified: Secondary | ICD-10-CM | POA: Diagnosis not present

## 2017-09-05 DIAGNOSIS — R29818 Other symptoms and signs involving the nervous system: Secondary | ICD-10-CM | POA: Diagnosis not present

## 2017-09-06 DIAGNOSIS — M792 Neuralgia and neuritis, unspecified: Secondary | ICD-10-CM | POA: Diagnosis not present

## 2017-09-06 DIAGNOSIS — R600 Localized edema: Secondary | ICD-10-CM | POA: Diagnosis not present

## 2017-09-06 DIAGNOSIS — R448 Other symptoms and signs involving general sensations and perceptions: Secondary | ICD-10-CM | POA: Diagnosis not present

## 2017-09-06 DIAGNOSIS — R4189 Other symptoms and signs involving cognitive functions and awareness: Secondary | ICD-10-CM | POA: Diagnosis not present

## 2017-09-07 DIAGNOSIS — R5381 Other malaise: Secondary | ICD-10-CM | POA: Diagnosis not present

## 2017-09-07 DIAGNOSIS — L98491 Non-pressure chronic ulcer of skin of other sites limited to breakdown of skin: Secondary | ICD-10-CM | POA: Diagnosis not present

## 2017-09-07 DIAGNOSIS — E669 Obesity, unspecified: Secondary | ICD-10-CM | POA: Diagnosis not present

## 2017-09-07 DIAGNOSIS — S31109A Unspecified open wound of abdominal wall, unspecified quadrant without penetration into peritoneal cavity, initial encounter: Secondary | ICD-10-CM | POA: Diagnosis not present

## 2017-09-08 DIAGNOSIS — G629 Polyneuropathy, unspecified: Secondary | ICD-10-CM | POA: Diagnosis not present

## 2017-09-08 DIAGNOSIS — I82509 Chronic embolism and thrombosis of unspecified deep veins of unspecified lower extremity: Secondary | ICD-10-CM | POA: Diagnosis not present

## 2017-09-08 DIAGNOSIS — Z7901 Long term (current) use of anticoagulants: Secondary | ICD-10-CM | POA: Diagnosis not present

## 2017-09-08 DIAGNOSIS — R5381 Other malaise: Secondary | ICD-10-CM | POA: Diagnosis not present

## 2017-09-16 DIAGNOSIS — L02415 Cutaneous abscess of right lower limb: Secondary | ICD-10-CM | POA: Diagnosis not present

## 2017-09-16 DIAGNOSIS — D72829 Elevated white blood cell count, unspecified: Secondary | ICD-10-CM | POA: Diagnosis not present

## 2017-10-07 DIAGNOSIS — I2699 Other pulmonary embolism without acute cor pulmonale: Secondary | ICD-10-CM | POA: Diagnosis not present

## 2017-10-07 DIAGNOSIS — E039 Hypothyroidism, unspecified: Secondary | ICD-10-CM | POA: Diagnosis not present

## 2017-10-07 DIAGNOSIS — I10 Essential (primary) hypertension: Secondary | ICD-10-CM | POA: Diagnosis not present

## 2017-11-10 DIAGNOSIS — T148XXA Other injury of unspecified body region, initial encounter: Secondary | ICD-10-CM | POA: Diagnosis not present

## 2017-11-16 DIAGNOSIS — X58XXXA Exposure to other specified factors, initial encounter: Secondary | ICD-10-CM | POA: Diagnosis not present

## 2017-11-16 DIAGNOSIS — S7012XA Contusion of left thigh, initial encounter: Secondary | ICD-10-CM | POA: Diagnosis not present

## 2017-11-16 DIAGNOSIS — T792XXA Traumatic secondary and recurrent hemorrhage and seroma, initial encounter: Secondary | ICD-10-CM | POA: Diagnosis not present

## 2017-12-02 DIAGNOSIS — F419 Anxiety disorder, unspecified: Secondary | ICD-10-CM | POA: Diagnosis not present

## 2017-12-02 DIAGNOSIS — Z125 Encounter for screening for malignant neoplasm of prostate: Secondary | ICD-10-CM | POA: Diagnosis not present

## 2017-12-02 DIAGNOSIS — G8929 Other chronic pain: Secondary | ICD-10-CM | POA: Diagnosis not present

## 2017-12-02 DIAGNOSIS — E039 Hypothyroidism, unspecified: Secondary | ICD-10-CM | POA: Diagnosis not present

## 2017-12-02 DIAGNOSIS — S7012XD Contusion of left thigh, subsequent encounter: Secondary | ICD-10-CM | POA: Diagnosis not present

## 2017-12-02 DIAGNOSIS — M545 Low back pain: Secondary | ICD-10-CM | POA: Diagnosis not present

## 2017-12-02 DIAGNOSIS — I10 Essential (primary) hypertension: Secondary | ICD-10-CM | POA: Diagnosis not present

## 2017-12-02 DIAGNOSIS — M7989 Other specified soft tissue disorders: Secondary | ICD-10-CM | POA: Diagnosis not present

## 2017-12-02 DIAGNOSIS — Z79899 Other long term (current) drug therapy: Secondary | ICD-10-CM | POA: Diagnosis not present

## 2017-12-02 DIAGNOSIS — R110 Nausea: Secondary | ICD-10-CM | POA: Diagnosis not present

## 2017-12-02 DIAGNOSIS — X58XXXD Exposure to other specified factors, subsequent encounter: Secondary | ICD-10-CM | POA: Diagnosis not present

## 2017-12-07 DIAGNOSIS — S7012XD Contusion of left thigh, subsequent encounter: Secondary | ICD-10-CM | POA: Diagnosis not present

## 2017-12-07 DIAGNOSIS — X58XXXD Exposure to other specified factors, subsequent encounter: Secondary | ICD-10-CM | POA: Diagnosis not present

## 2017-12-07 DIAGNOSIS — M7989 Other specified soft tissue disorders: Secondary | ICD-10-CM | POA: Diagnosis not present

## 2017-12-16 DIAGNOSIS — X58XXXD Exposure to other specified factors, subsequent encounter: Secondary | ICD-10-CM | POA: Diagnosis not present

## 2017-12-16 DIAGNOSIS — S7012XD Contusion of left thigh, subsequent encounter: Secondary | ICD-10-CM | POA: Diagnosis not present

## 2017-12-16 DIAGNOSIS — M7989 Other specified soft tissue disorders: Secondary | ICD-10-CM | POA: Diagnosis not present

## 2017-12-16 DIAGNOSIS — E039 Hypothyroidism, unspecified: Secondary | ICD-10-CM | POA: Diagnosis not present

## 2017-12-23 DIAGNOSIS — S7012XD Contusion of left thigh, subsequent encounter: Secondary | ICD-10-CM | POA: Diagnosis not present

## 2017-12-23 DIAGNOSIS — X58XXXD Exposure to other specified factors, subsequent encounter: Secondary | ICD-10-CM | POA: Diagnosis not present

## 2017-12-23 DIAGNOSIS — M7989 Other specified soft tissue disorders: Secondary | ICD-10-CM | POA: Diagnosis not present

## 2018-01-02 DIAGNOSIS — R509 Fever, unspecified: Secondary | ICD-10-CM | POA: Diagnosis not present

## 2018-01-02 DIAGNOSIS — R5081 Fever presenting with conditions classified elsewhere: Secondary | ICD-10-CM | POA: Diagnosis not present

## 2018-01-02 DIAGNOSIS — S8781XD Crushing injury of right lower leg, subsequent encounter: Secondary | ICD-10-CM | POA: Diagnosis not present

## 2018-01-02 DIAGNOSIS — S8782XD Crushing injury of left lower leg, subsequent encounter: Secondary | ICD-10-CM | POA: Diagnosis not present

## 2018-01-02 DIAGNOSIS — S71102A Unspecified open wound, left thigh, initial encounter: Secondary | ICD-10-CM | POA: Diagnosis not present

## 2018-01-02 DIAGNOSIS — M79605 Pain in left leg: Secondary | ICD-10-CM | POA: Diagnosis not present

## 2018-01-03 DIAGNOSIS — S71102A Unspecified open wound, left thigh, initial encounter: Secondary | ICD-10-CM | POA: Diagnosis not present

## 2018-01-03 DIAGNOSIS — R509 Fever, unspecified: Secondary | ICD-10-CM | POA: Diagnosis not present

## 2018-01-03 DIAGNOSIS — D72829 Elevated white blood cell count, unspecified: Secondary | ICD-10-CM | POA: Diagnosis not present

## 2018-01-03 DIAGNOSIS — Z86711 Personal history of pulmonary embolism: Secondary | ICD-10-CM | POA: Diagnosis not present

## 2018-01-03 DIAGNOSIS — T148XXA Other injury of unspecified body region, initial encounter: Secondary | ICD-10-CM | POA: Diagnosis not present

## 2018-01-27 DIAGNOSIS — T792XXA Traumatic secondary and recurrent hemorrhage and seroma, initial encounter: Secondary | ICD-10-CM | POA: Diagnosis not present

## 2018-01-27 DIAGNOSIS — X58XXXA Exposure to other specified factors, initial encounter: Secondary | ICD-10-CM | POA: Diagnosis not present

## 2018-01-27 DIAGNOSIS — M7989 Other specified soft tissue disorders: Secondary | ICD-10-CM | POA: Diagnosis not present

## 2018-02-03 DIAGNOSIS — Z4803 Encounter for change or removal of drains: Secondary | ICD-10-CM | POA: Diagnosis not present

## 2018-02-03 DIAGNOSIS — Z4682 Encounter for fitting and adjustment of non-vascular catheter: Secondary | ICD-10-CM | POA: Diagnosis not present

## 2018-03-02 DIAGNOSIS — S81802D Unspecified open wound, left lower leg, subsequent encounter: Secondary | ICD-10-CM | POA: Diagnosis not present

## 2018-03-24 DIAGNOSIS — L03116 Cellulitis of left lower limb: Secondary | ICD-10-CM | POA: Diagnosis not present

## 2018-05-22 HISTORY — PX: LEG SURGERY: SHX1003

## 2018-06-06 DIAGNOSIS — R7303 Prediabetes: Secondary | ICD-10-CM | POA: Diagnosis not present

## 2018-06-06 DIAGNOSIS — E039 Hypothyroidism, unspecified: Secondary | ICD-10-CM | POA: Diagnosis not present

## 2018-06-06 DIAGNOSIS — F419 Anxiety disorder, unspecified: Secondary | ICD-10-CM | POA: Diagnosis not present

## 2018-06-06 DIAGNOSIS — I10 Essential (primary) hypertension: Secondary | ICD-10-CM | POA: Diagnosis not present

## 2018-06-22 DIAGNOSIS — H524 Presbyopia: Secondary | ICD-10-CM | POA: Diagnosis not present

## 2018-06-22 DIAGNOSIS — H04123 Dry eye syndrome of bilateral lacrimal glands: Secondary | ICD-10-CM | POA: Diagnosis not present

## 2018-06-22 DIAGNOSIS — H52222 Regular astigmatism, left eye: Secondary | ICD-10-CM | POA: Diagnosis not present

## 2018-06-22 DIAGNOSIS — H5203 Hypermetropia, bilateral: Secondary | ICD-10-CM | POA: Diagnosis not present

## 2018-08-17 DIAGNOSIS — Z981 Arthrodesis status: Secondary | ICD-10-CM | POA: Diagnosis not present

## 2018-08-17 DIAGNOSIS — R5383 Other fatigue: Secondary | ICD-10-CM | POA: Diagnosis not present

## 2018-08-17 DIAGNOSIS — L03116 Cellulitis of left lower limb: Secondary | ICD-10-CM | POA: Diagnosis not present

## 2018-08-17 DIAGNOSIS — I10 Essential (primary) hypertension: Secondary | ICD-10-CM | POA: Diagnosis not present

## 2018-08-17 DIAGNOSIS — Z86718 Personal history of other venous thrombosis and embolism: Secondary | ICD-10-CM | POA: Diagnosis not present

## 2018-08-17 DIAGNOSIS — G629 Polyneuropathy, unspecified: Secondary | ICD-10-CM | POA: Diagnosis not present

## 2018-08-17 DIAGNOSIS — F17220 Nicotine dependence, chewing tobacco, uncomplicated: Secondary | ICD-10-CM | POA: Diagnosis not present

## 2018-08-17 DIAGNOSIS — R000 Tachycardia, unspecified: Secondary | ICD-10-CM | POA: Diagnosis not present

## 2018-08-17 DIAGNOSIS — Z86711 Personal history of pulmonary embolism: Secondary | ICD-10-CM | POA: Diagnosis not present

## 2018-08-17 DIAGNOSIS — E039 Hypothyroidism, unspecified: Secondary | ICD-10-CM | POA: Diagnosis not present

## 2018-08-17 DIAGNOSIS — Z7901 Long term (current) use of anticoagulants: Secondary | ICD-10-CM | POA: Diagnosis not present

## 2018-08-17 DIAGNOSIS — Z79899 Other long term (current) drug therapy: Secondary | ICD-10-CM | POA: Diagnosis not present

## 2018-08-17 DIAGNOSIS — R509 Fever, unspecified: Secondary | ICD-10-CM | POA: Diagnosis not present

## 2018-08-17 DIAGNOSIS — Z800 Family history of malignant neoplasm of digestive organs: Secondary | ICD-10-CM | POA: Diagnosis not present

## 2018-08-17 DIAGNOSIS — Z96611 Presence of right artificial shoulder joint: Secondary | ICD-10-CM | POA: Diagnosis not present

## 2018-08-17 DIAGNOSIS — Z880 Allergy status to penicillin: Secondary | ICD-10-CM | POA: Diagnosis not present

## 2018-08-18 DIAGNOSIS — G629 Polyneuropathy, unspecified: Secondary | ICD-10-CM | POA: Diagnosis not present

## 2018-08-18 DIAGNOSIS — R000 Tachycardia, unspecified: Secondary | ICD-10-CM | POA: Diagnosis not present

## 2018-08-18 DIAGNOSIS — R509 Fever, unspecified: Secondary | ICD-10-CM | POA: Diagnosis not present

## 2018-08-18 DIAGNOSIS — L03116 Cellulitis of left lower limb: Secondary | ICD-10-CM | POA: Diagnosis not present

## 2018-08-18 DIAGNOSIS — I10 Essential (primary) hypertension: Secondary | ICD-10-CM | POA: Diagnosis not present

## 2018-08-18 DIAGNOSIS — E039 Hypothyroidism, unspecified: Secondary | ICD-10-CM | POA: Diagnosis not present

## 2018-09-21 DIAGNOSIS — M6751 Plica syndrome, right knee: Secondary | ICD-10-CM | POA: Diagnosis not present

## 2018-10-31 DIAGNOSIS — M533 Sacrococcygeal disorders, not elsewhere classified: Secondary | ICD-10-CM | POA: Diagnosis not present

## 2018-11-10 DIAGNOSIS — Y998 Other external cause status: Secondary | ICD-10-CM | POA: Diagnosis not present

## 2018-11-10 DIAGNOSIS — Z01812 Encounter for preprocedural laboratory examination: Secondary | ICD-10-CM | POA: Diagnosis not present

## 2018-11-10 DIAGNOSIS — Z1159 Encounter for screening for other viral diseases: Secondary | ICD-10-CM | POA: Diagnosis not present

## 2018-11-10 DIAGNOSIS — S83231A Complex tear of medial meniscus, current injury, right knee, initial encounter: Secondary | ICD-10-CM | POA: Diagnosis not present

## 2018-12-07 DIAGNOSIS — Z86718 Personal history of other venous thrombosis and embolism: Secondary | ICD-10-CM | POA: Diagnosis not present

## 2018-12-07 DIAGNOSIS — K921 Melena: Secondary | ICD-10-CM | POA: Diagnosis not present

## 2018-12-07 DIAGNOSIS — M25559 Pain in unspecified hip: Secondary | ICD-10-CM | POA: Diagnosis not present

## 2018-12-12 DIAGNOSIS — R238 Other skin changes: Secondary | ICD-10-CM | POA: Diagnosis not present

## 2018-12-12 DIAGNOSIS — D2371 Other benign neoplasm of skin of right lower limb, including hip: Secondary | ICD-10-CM | POA: Diagnosis not present

## 2018-12-12 DIAGNOSIS — L821 Other seborrheic keratosis: Secondary | ICD-10-CM | POA: Diagnosis not present

## 2019-01-17 DIAGNOSIS — Z23 Encounter for immunization: Secondary | ICD-10-CM | POA: Diagnosis not present

## 2019-02-16 DIAGNOSIS — I10 Essential (primary) hypertension: Secondary | ICD-10-CM | POA: Diagnosis not present

## 2019-02-16 DIAGNOSIS — G8929 Other chronic pain: Secondary | ICD-10-CM | POA: Diagnosis not present

## 2019-02-16 DIAGNOSIS — G4730 Sleep apnea, unspecified: Secondary | ICD-10-CM | POA: Diagnosis not present

## 2019-02-16 DIAGNOSIS — G629 Polyneuropathy, unspecified: Secondary | ICD-10-CM | POA: Diagnosis not present

## 2019-02-22 DIAGNOSIS — I10 Essential (primary) hypertension: Secondary | ICD-10-CM | POA: Diagnosis not present

## 2019-02-22 DIAGNOSIS — D649 Anemia, unspecified: Secondary | ICD-10-CM | POA: Diagnosis not present

## 2019-02-22 DIAGNOSIS — E039 Hypothyroidism, unspecified: Secondary | ICD-10-CM | POA: Diagnosis not present

## 2019-02-22 DIAGNOSIS — Z23 Encounter for immunization: Secondary | ICD-10-CM | POA: Diagnosis not present

## 2019-02-22 DIAGNOSIS — Z125 Encounter for screening for malignant neoplasm of prostate: Secondary | ICD-10-CM | POA: Diagnosis not present

## 2019-02-22 DIAGNOSIS — N6314 Unspecified lump in the right breast, lower inner quadrant: Secondary | ICD-10-CM | POA: Diagnosis not present

## 2019-02-22 DIAGNOSIS — G8929 Other chronic pain: Secondary | ICD-10-CM | POA: Diagnosis not present

## 2019-03-01 DIAGNOSIS — R799 Abnormal finding of blood chemistry, unspecified: Secondary | ICD-10-CM | POA: Diagnosis not present

## 2019-03-22 DIAGNOSIS — Z87891 Personal history of nicotine dependence: Secondary | ICD-10-CM | POA: Diagnosis not present

## 2019-03-22 DIAGNOSIS — N6310 Unspecified lump in the right breast, unspecified quadrant: Secondary | ICD-10-CM | POA: Diagnosis not present

## 2019-04-20 DIAGNOSIS — G4733 Obstructive sleep apnea (adult) (pediatric): Secondary | ICD-10-CM | POA: Diagnosis not present

## 2019-05-01 DIAGNOSIS — G4733 Obstructive sleep apnea (adult) (pediatric): Secondary | ICD-10-CM | POA: Diagnosis not present

## 2019-05-22 DIAGNOSIS — G4733 Obstructive sleep apnea (adult) (pediatric): Secondary | ICD-10-CM | POA: Diagnosis not present

## 2019-06-20 DIAGNOSIS — G8921 Chronic pain due to trauma: Secondary | ICD-10-CM | POA: Diagnosis not present

## 2019-06-20 DIAGNOSIS — M109 Gout, unspecified: Secondary | ICD-10-CM | POA: Diagnosis not present

## 2019-06-20 DIAGNOSIS — Z23 Encounter for immunization: Secondary | ICD-10-CM | POA: Diagnosis not present

## 2019-06-20 DIAGNOSIS — G4730 Sleep apnea, unspecified: Secondary | ICD-10-CM | POA: Diagnosis not present

## 2019-06-20 DIAGNOSIS — I10 Essential (primary) hypertension: Secondary | ICD-10-CM | POA: Diagnosis not present

## 2019-11-06 DIAGNOSIS — M79652 Pain in left thigh: Secondary | ICD-10-CM | POA: Diagnosis not present

## 2019-11-06 DIAGNOSIS — Z131 Encounter for screening for diabetes mellitus: Secondary | ICD-10-CM | POA: Diagnosis not present

## 2019-11-06 DIAGNOSIS — Z1321 Encounter for screening for nutritional disorder: Secondary | ICD-10-CM | POA: Diagnosis not present

## 2019-11-06 DIAGNOSIS — M79651 Pain in right thigh: Secondary | ICD-10-CM | POA: Diagnosis not present

## 2019-11-06 DIAGNOSIS — I10 Essential (primary) hypertension: Secondary | ICD-10-CM | POA: Diagnosis not present

## 2019-11-06 DIAGNOSIS — G8929 Other chronic pain: Secondary | ICD-10-CM | POA: Diagnosis not present

## 2019-11-06 DIAGNOSIS — N4889 Other specified disorders of penis: Secondary | ICD-10-CM | POA: Diagnosis not present

## 2019-12-01 DIAGNOSIS — M79652 Pain in left thigh: Secondary | ICD-10-CM | POA: Diagnosis not present

## 2019-12-01 DIAGNOSIS — M79651 Pain in right thigh: Secondary | ICD-10-CM | POA: Diagnosis not present

## 2019-12-01 DIAGNOSIS — G8929 Other chronic pain: Secondary | ICD-10-CM | POA: Diagnosis not present

## 2020-02-07 DIAGNOSIS — Q5561 Curvature of penis (lateral): Secondary | ICD-10-CM | POA: Diagnosis not present

## 2020-03-21 DIAGNOSIS — M79651 Pain in right thigh: Secondary | ICD-10-CM | POA: Diagnosis not present

## 2020-03-21 DIAGNOSIS — S7712XS Crushing injury of left thigh, sequela: Secondary | ICD-10-CM | POA: Diagnosis not present

## 2020-03-21 DIAGNOSIS — Z23 Encounter for immunization: Secondary | ICD-10-CM | POA: Diagnosis not present

## 2020-03-21 DIAGNOSIS — M79652 Pain in left thigh: Secondary | ICD-10-CM | POA: Diagnosis not present

## 2020-03-21 DIAGNOSIS — G8929 Other chronic pain: Secondary | ICD-10-CM | POA: Diagnosis not present

## 2020-04-02 ENCOUNTER — Encounter: Payer: Self-pay | Admitting: Urology

## 2020-04-02 ENCOUNTER — Ambulatory Visit (INDEPENDENT_AMBULATORY_CARE_PROVIDER_SITE_OTHER): Payer: No Typology Code available for payment source | Admitting: Urology

## 2020-04-02 ENCOUNTER — Other Ambulatory Visit: Payer: Self-pay

## 2020-04-02 VITALS — BP 148/67 | HR 76 | Temp 98.6°F | Ht 74.0 in | Wt 320.0 lb

## 2020-04-02 DIAGNOSIS — N486 Induration penis plastica: Secondary | ICD-10-CM

## 2020-04-02 MED ORDER — PENTOXIFYLLINE ER 400 MG PO TBCR: 400.0000 mg | EXTENDED_RELEASE_TABLET | Freq: Two times a day (BID) | ORAL | 2 refills | Status: DC

## 2020-04-02 NOTE — Progress Notes (Signed)
@ENCDATE @ 10:31 AM   [birth year]  Referring provider: [id number], MD [address] [place],  [place]  Penile curvature  HPI: Scott Preston is a 59yo here for evaluation of penile curvature. In May 2021 he had pain and a buckling with intercourse. He has been having pain with erections since May 2021. He has dorsal and left lateral curvature at the mid penile shaft. He has been able to feel a knot since May 2021. No issues getting an erection but his erection is semifirm past the mid penile shaft. Based on pictures provided by the patient he has 45 decrease dorsal curvature at the mid penile shaft. No issues with urination.    PMH: Past Medical History:  Diagnosis Date   Clotting disorder (HCC)    Gout    Headache    Hypertension    Neck pain    Neuropathy    feet   Sleep apnea    Thyroid disease     Surgical History: Past Surgical History:  Procedure Laterality Date   ABDOMINAL SURGERY     HERNIA REPAIR     KNEE SURGERY     LEG SURGERY  05/22/2018   skin graft   SPINAL FUSION  2017   thigh surgery  left    Home Medications:  Allergies as of 04/02/2020      Reactions   Penicillins Anaphylaxis   Tongue close up and swelling      Medication List       Accurate as of April 02, 2020 10:31 AM. If you have any questions, ask your nurse or doctor.        allopurinol 300 MG tablet Commonly known as: ZYLOPRIM Take 300 mg by mouth daily.   aspirin 81 MG tablet Take 81 mg by mouth daily.   colchicine 0.3 MG tablet Take 0.6 mg by mouth daily.   cyclobenzaprine 5 MG tablet Commonly known as: FLEXERIL Take 2 tablets (10 mg total) by mouth 2 (two) times daily as needed for muscle spasms.   gabapentin 400 MG capsule Commonly known as: NEURONTIN Take by mouth. What changed: Another medication with the same name was removed. Continue taking this medication, and follow the directions you see here. Changed by:  April 04, 2020, MD   gabapentin 100 MG capsule Commonly known as: NEURONTIN Take 100 mg by mouth 3 (three) times daily. What changed: Another medication with the same name was removed. Continue taking this medication, and follow the directions you see here. Changed by: Wilkie Aye, MD   hydrOXYzine 10 MG tablet Commonly known as: ATARAX/VISTARIL Take 10 mg by mouth 3 (three) times daily.   lisinopril 20 MG tablet Commonly known as: ZESTRIL Take 20 mg by mouth daily.   losartan 100 MG tablet Commonly known as: COZAAR Take 100 mg by mouth daily.   losartan-hydrochlorothiazide 100-12.5 MG tablet Commonly known as: HYZAAR Take 1 tablet by mouth daily.   methocarbamol 500 MG tablet Commonly known as: ROBAXIN   Synthroid 175 MCG tablet Generic drug: levothyroxine Take 175 mcg by mouth daily. What changed: Another medication with the same name was removed. Continue taking this medication, and follow the directions you see here. Changed by: Wilkie Aye, MD   traZODone 100 MG tablet Commonly known as: DESYREL   Xarelto 20 MG Tabs tablet Generic drug: rivaroxaban Take 20 mg by mouth daily.       Allergies:  Allergies  Allergen Reactions   Penicillins Anaphylaxis  Tongue close up and swelling    Family History: Family History  Problem Relation Age of Onset   Stroke Father    Pancreatic cancer Father     Social History:  reports that he quit smoking about 37 years ago. He has a 1.00 pack-year smoking history. His smokeless tobacco use includes chew. He reports current alcohol use. No history on file for drug use.  ROS: All other review of systems were reviewed and are negative except what is noted above in HPI  Physical Exam: BP (!) 148/67    Pulse 76    Temp 98.6 F (37 C)    Ht 6\' 2"  (1.88 m)    Wt (!) 320 lb (145.2 kg)    BMI 41.09 kg/m   Constitutional:  Alert and oriented, No acute distress. HEENT: [place] AT, moist mucus membranes.  Trachea  midline, no masses. Cardiovascular: No clubbing, cyanosis, or edema. Respiratory: Normal respiratory effort, no increased work of breathing. GI: Abdomen is soft, nontender, nondistended, no abdominal masses GU: No CVA tenderness. Circumcised phallus. No masses/lesions on penis, testis, scrotum. 2cm dorsal peyronies plaque at the base.  Lymph: No cervical or inguinal lymphadenopathy. Skin: No rashes, bruises or suspicious lesions. Neurologic: Grossly intact, no focal deficits, moving all 4 extremities. Psychiatric: Normal mood and affect.  Laboratory Data: No results found for: WBC, HGB, HCT, MCV, PLT  No results found for: CREATININE  No results found for: PSA  No results found for: TESTOSTERONE  No results found for: HGBA1C  Urinalysis No results found for: COLORURINE, APPEARANCEUR, LABSPEC, PHURINE, GLUCOSEU, HGBUR, BILIRUBINUR, KETONESUR, PROTEINUR, UROBILINOGEN, NITRITE, LEUKOCYTESUR  No results found for: LABMICR, WBCUA, RBCUA, LABEPIT, MUCUS, BACTERIA  Pertinent Imaging:  No results found for this or any previous visit.  No results found for this or any previous visit.  No results found for this or any previous visit.  No results found for this or any previous visit.  No results found for this or any previous visit.  No results found for this or any previous visit.  No results found for this or any previous visit.  No results found for this or any previous visit.   Assessment & Plan:    1. Peyronie disease -We discussed the management of peyronies disease including medical therapy, penile plication, verapamil therapy and xiaflex therapy. After discussed the options the patient elects for medical therapy since he is in the active phase of peyronies disease. I will see him back in 3 months. We will trial trental 400mg  BID for 3 months - Urinalysis, Routine w reflex microscopic   No follow-ups on file.  , MD  [facility] [place]

## 2020-04-02 NOTE — Patient Instructions (Addendum)
Collagenase injection (Dupuytren's Contracture/Peyronie's Disease) What is this medicine? COLLAGENASE (kohl LAH jen ace) is used to treat Dupuytren's contracture. This medicine may help straighten a bent finger by breaking up hard tissue. It is also used for Peyronie's disease by breaking up the hard tissue plaque that causes the curvature in the penis. This medicine may be used for other purposes; ask your health care provider or pharmacist if you have questions. COMMON BRAND NAME(S): Xiaflex What should I tell my health care provider before I take this medicine? They need to know if you have any of these conditions:  hemophilia  low platelet counts  take medicines that treat or prevent blood clots  an unusual or allergic reaction to collagenase, other medicines, foods, dyes, or preservatives  pregnant or trying to get pregnant  breast-feeding How should I use this medicine? This medicine is for injection into the hand or penis. It is given by a health care professional in a hospital or clinic setting. A special MedGuide will be given to you by the pharmacist with each prescription and refill. Be sure to read this information carefully each time. Talk to your pediatrician regarding the use of this medicine in children. Special care may be needed. Overdosage: If you think you have taken too much of this medicine contact a poison control center or emergency room at once. NOTE: This medicine is only for you. Do not share this medicine with others. What if I miss a dose? It is important not to miss your dose. Call your doctor or health care professional if you are unable to keep an appointment. What may interact with this medicine?  aspirin and aspirin-like medicines  certain medicines that treat or prevent blood clots like warfarin, enoxaparin, and dalteparin This list may not describe all possible interactions. Give your health care provider a list of all the medicines, herbs,  non-prescription drugs, or dietary supplements you use. Also tell them if you smoke, drink alcohol, or use illegal drugs. Some items may interact with your medicine. What should I watch for while using this medicine? Your condition will be monitored carefully while you are receiving this medicine. If being treated for Dupuytren's contracture, return to your healthcare provider the day after your hand is injected. In the meantime, do not flex or extend the fingers of your hand that was injected. Do not touch your finger that was injected, and elevate your hand until bedtime. Do not perform activity with the injected hand until you are told that it is OK. Follow any instructions about wearing a splint or performing finger exercises. Also, call your healthcare provider if you get increasing redness or swelling in the hand, if you have numbness or tingling in the treated finger, or if you have trouble bending the finger after the swelling goes down. If being treated for Peyronie's disease, you will need to return to your healthcare provider for a manual procedure that will stretch and help straighten your penis. Also, your healthcare provider will show you how to gently stretch your penis at home. Do not resume sexual activity until you are told that it is okay. Follow instructions on when to return for follow-up visits. Immediately call your doctor if you have trouble stretching or straightening your penis, or if you have pain or other concerns. Immediately call your healthcare provider if you get a fever or chills. What side effects may I notice from receiving this medicine? Side effects that you should report to your doctor or health   care professional as soon as possible:  allergic reactions like skin rash, itching or hives, swelling of the face, lips, or tongue  breathing problems  chest pain or palpitations  pain in your penis  pain when urinating  red or dark-brown urine  sudden loss of the  ability to maintain an erection  swelling of the injected hand  unusual swelling or bruising of the penis Side effects that usually do not require medical attention (report to your doctor or health care professional if they continue or are bothersome):  irritation at site where injected  pain at site where injected  unusual bleeding or bruising This list may not describe all possible side effects. Call your doctor for medical advice about side effects. You may report side effects to FDA at 1-800-FDA-1088. Where should I keep my medicine? This drug is given in a hospital or clinic and will not be stored at home. NOTE: This sheet is a summary. It may not cover all possible information. If you have questions about this medicine, talk to your doctor, pharmacist, or health care provider.  2020 Elsevier/Gold Standard (2015-05-01 09:34:13)  

## 2020-04-02 NOTE — Progress Notes (Signed)
Urological Symptom Review  Patient is experiencing the following symptoms: Frequent urination Painful intercourse Penile pain (male only)   Hx of kidney stones   Review of Systems  Gastrointestinal (upper)  : Negative for upper GI symptoms  Gastrointestinal (lower) : Negative for lower GI symptoms  Constitutional : Negative for symptoms  Skin: Negative for skin symptoms  Eyes: Negative for eye symptoms  Ear/Nose/Throat : Negative for Ear/Nose/Throat symptoms  Hematologic/Lymphatic: Easy bruising  Cardiovascular : Leg swelling  Respiratory : Negative for respiratory symptoms  Endocrine: Negative for endocrine symptoms  Musculoskeletal: Back pain Joint pain  Neurological: Negative for neurological symptoms  Psychologic: Negative for psychiatric symptoms

## 2020-05-05 ENCOUNTER — Encounter: Payer: Self-pay | Admitting: Physical Medicine & Rehabilitation

## 2020-06-03 ENCOUNTER — Other Ambulatory Visit: Payer: Self-pay

## 2020-06-03 ENCOUNTER — Encounter
Payer: No Typology Code available for payment source | Attending: Physical Medicine & Rehabilitation | Admitting: Physical Medicine & Rehabilitation

## 2020-06-03 ENCOUNTER — Encounter: Payer: Self-pay | Admitting: Physical Medicine & Rehabilitation

## 2020-06-03 VITALS — BP 130/5 | HR 86 | Temp 98.8°F | Ht 74.0 in | Wt 329.2 lb

## 2020-06-03 DIAGNOSIS — R202 Paresthesia of skin: Secondary | ICD-10-CM | POA: Diagnosis not present

## 2020-06-03 DIAGNOSIS — G5713 Meralgia paresthetica, bilateral lower limbs: Secondary | ICD-10-CM | POA: Insufficient documentation

## 2020-06-03 MED ORDER — PREGABALIN 50 MG PO CAPS: 50.0000 mg | ORAL_CAPSULE | Freq: Two times a day (BID) | ORAL | 1 refills | Status: DC

## 2020-06-03 NOTE — Patient Instructions (Signed)
Add pregabalin to gabapentin  Try samples of lidocaine cream to the painful area Left hip 2-3 times a day

## 2020-06-03 NOTE — Progress Notes (Signed)
Subjective:    Patient ID: Scott Preston, male    DOB: [birth year], 60 y.o.   MRN: [id number]  HPI Crush injury at work resulting in Hustler injury in May 2019 primary to left lateral hip with multiple infections and debridement, wound vac and eventually skin graft.  Patient also had a small area in the right posterior thigh.  Hospitalized and treated at [facility], treated and settled workers comp injury now seen at [place].  Walking and sitting affects the Left hip, walking affects the right posterior thigh.  The pain is described as tingly.  He has been treated with gabapentin 900 mg 3 times a day.  He notices when he does not take the medicine on time or skips a couple doses his pain increases significantly.  He has not been taking narcotic analgesics.  He has not tried any topical medications. The patient has returned to work and has a sedentary position.  He has gained at least 20 pounds post injury. The patient is independent with all self-care and mobility with complaints of pain throughout the day.  He does get some relief of pain when laying on his right side with a pillow between his legs. Pain Inventory Average Pain 4 Pain Right Now 4 My pain is constant, sharp, dull, stabbing, tingling and aching  In the last 24 hours, has pain interfered with the following? General activity 5 Relation with others 4 Enjoyment of life 5 What TIME of day is your pain at its worst? daytime and evening Sleep (in general) Fair  Pain is worse with: walking, bending, sitting and some activites Pain improves with: rest Relief from Meds: 8  walk without assistance how many minutes can you walk? 10 mins ability to climb steps?  yes do you drive?  yes Do you have any goals in this area?  yes  employed # of hrs/week 40 hours a week Maintenance [employer]  numbness tingling trouble walking spasms depression  Any changes since last visit?  no New Patient  Any changes since last visit?  no New  Patient    Family History  Problem Relation Age of Onset  . Stroke Father   . Pancreatic cancer Father    Social History   Socioeconomic History  . Marital status: Married    Spouse name: Not on file  . Number of children: 3  . Years of education: Not on file  . Highest education level: Not on file  Occupational History  . Occupation: Scientist, research (medical)  Tobacco Use  . Smoking status: Former Smoker    Packs/day: 0.50    Years: 2.00    Pack years: 1.00    Quit date: 04/03/1983    Years since quitting: 37.1  . Smokeless tobacco: Current User    Types: Chew  Substance and Sexual Activity  . Alcohol use: Yes    Comment: occasionally beer  . Drug use: Not on file  . Sexual activity: Not on file  Other Topics Concern  . Not on file  Social History Narrative  . Not on file   Social Determinants of Health   Financial Resource Strain: Not on file  Food Insecurity: Not on file  Transportation Needs: Not on file  Physical Activity: Not on file  Stress: Not on file  Social Connections: Not on file   Past Surgical History:  Procedure Laterality Date  . ABDOMINAL SURGERY    . HERNIA REPAIR    . KNEE SURGERY    . LEG  SURGERY  05/22/2018   skin graft  . SPINAL FUSION  2017  . thigh surgery  left   Past Medical History:  Diagnosis Date  . Clotting disorder (HCC)   . Gout   . Headache   . Hypertension   . Neck pain   . Neuropathy    feet  . Sleep apnea   . Thyroid disease    There were no vitals taken for this visit.  Opioid Risk Score:   Fall Risk Score:  `1  Depression screen PHQ 2/9  No flowsheet data found. Review of Systems  Musculoskeletal: Positive for gait problem.       Pain on both sides from waist down to knee Left side outer area On right back side  All other systems reviewed and are negative.      Objective:   Physical Exam Vitals and nursing note reviewed.  Constitutional:      General: He is not in acute distress.    Appearance:  He is obese.  HENT:     Head: Normocephalic and atraumatic.  Eyes:     Extraocular Movements: Extraocular movements intact.     Conjunctiva/sclera: Conjunctivae normal.     Pupils: Pupils are equal, round, and reactive to light.  Cardiovascular:     Rate and Rhythm: Regular rhythm.  Pulmonary:     Effort: Pulmonary effort is normal. No respiratory distress.     Breath sounds: Normal breath sounds. No stridor.  Abdominal:     General: Abdomen is flat. There is distension.     Palpations: Abdomen is soft.     Tenderness: There is no abdominal tenderness.  Musculoskeletal:     Cervical back: No rigidity or tenderness.     Comments: No tenderness in the lumbar spine Negative distraction test Negative femoral thrust Negative straight leg raising There is soft tissue defect and extensive scar tissue as well as skin graft over the lateral hip, approximately 10 cm diameter. Right posterior thigh has a 2 to 3 cm soft tissue defect round in the mid hamstring area   Skin:    General: Skin is warm and dry.     Comments: Extensive scarring healed skin graft left lateral thigh mildly tender to palpation no drainage or open areas  Neurological:     Mental Status: He is alert.     Comments: There is decreased sensation bilateral lateral aspect of the thigh to light touch. Motor strength is 5/5 bilateral hip flexor knee extensor ankle dorsiflexor.  Psychiatric:        Mood and Affect: Mood normal.        Behavior: Behavior normal.        Thought Content: Thought content normal.        Judgment: Judgment normal.           Assessment & Plan:  #1.  Neurogenic pain related to extensive soft tissue injury primarily in the left lateral thigh.  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  There is also localized neuropathic pain over the left lateral thigh related to soft tissue injury as well as multiple infections. We discussed pain management  options including avoidance of narcotic analgesics. We discussed using topical agent and have given him samples of lidocaine 4% cream to tubes each of them proximately 5 g, if this is helpful on a twice daily or 3 times daily basis we can prescribe this. In addition we will add pregabalin 50  mg twice daily to the gabapentin 900 3 times daily once the patient is on Lyrica on a consistent basis can start escalating that dose and reducing the gabapentin dose. As discussed with patient I do not think any interventional procedures are needed at this time we discussed the possibility of doing lateral femoral cutaneous injections although these tend to be very short-term in terms of duration of relief

## 2020-07-01 ENCOUNTER — Encounter: Payer: Self-pay | Admitting: Physical Medicine & Rehabilitation

## 2020-07-01 ENCOUNTER — Other Ambulatory Visit: Payer: Self-pay

## 2020-07-01 ENCOUNTER — Encounter
Payer: No Typology Code available for payment source | Attending: Physical Medicine & Rehabilitation | Admitting: Physical Medicine & Rehabilitation

## 2020-07-01 VITALS — BP 139/84 | HR 73 | Temp 98.3°F | Ht 74.0 in | Wt 325.4 lb

## 2020-07-01 DIAGNOSIS — G5713 Meralgia paresthetica, bilateral lower limbs: Secondary | ICD-10-CM

## 2020-07-01 MED ORDER — DULOXETINE HCL 30 MG PO CPEP: 30.0000 mg | ORAL_CAPSULE | Freq: Every day | ORAL | 1 refills | Status: DC

## 2020-07-01 NOTE — Progress Notes (Signed)
Subjective:    Patient ID: Scott Preston, male    DOB: Jan 02, 1961, 60 y.o.   MRN: [id number] Crush injury at work resulting in [place] injury in May 2019 primary to left lateral hip with multiple infections and debridement, wound vac and eventually skin graft.  Patient also had a small area in the right posterior thigh.  Hospitalized and treated at [facility], treated and settled workers comp injury now seen at [place].  Walking and sitting affects the Left hip, walking affects the right posterior thigh.  The pain is described as tingly.  He has been treated with gabapentin 900 mg 3 times a day.  HPI  Since last visit the patient indicates that his pain has not improved.  His current pain level is 4 but sometimes gets higher especially when he bends at his waist or twists.  He has had an abrasion which he feels is related to his belt and is planning to see his plastic surgeon for this. At home the patient wears loosefitting clothing but at work he needs to wear jeans and a belt. The patient indicates that his pain characterization is stinging burning shooting mainly at the lateral hips left side greater than right side. Pain Inventory Average Pain 3 Pain Right Now 4 My pain is constant, sharp, stabbing, tingling and stinging  In the last 24 hours, has pain interfered with the following? General activity 5 Relation with others 3 Enjoyment of life 7 What TIME of day is your pain at its worst? daytime, evening and night Sleep (in general) Good  Pain is worse with: walking, bending, inactivity and some activites Pain improves with: rest Relief from Meds: na  Family History  Problem Relation Age of Onset  . Stroke Father   . Pancreatic cancer Father    Social History   Socioeconomic History  . Marital status: Married    Spouse name: Not on file  . Number of children: 3  . Years of education: Not on file  . Highest education level: Not on file  Occupational History  . Occupation:  Scientist, research (medical)  Tobacco Use  . Smoking status: Former Smoker    Packs/day: 0.50    Years: 2.00    Pack years: 1.00    Quit date: 04/03/1983    Years since quitting: 37.2  . Smokeless tobacco: Current User    Types: Chew  Vaping Use  . Vaping Use: Never used  Substance and Sexual Activity  . Alcohol use: Yes    Comment: occasionally beer  . Drug use: Not on file  . Sexual activity: Not on file  Other Topics Concern  . Not on file  Social History Narrative  . Not on file   Social Determinants of Health   Financial Resource Strain: Not on file  Food Insecurity: Not on file  Transportation Needs: Not on file  Physical Activity: Not on file  Stress: Not on file  Social Connections: Not on file   Past Surgical History:  Procedure Laterality Date  . ABDOMINAL SURGERY    . HERNIA REPAIR    . KNEE SURGERY    . LEG SURGERY  05/22/2018   skin graft  . SPINAL FUSION  2017  . thigh surgery  left   Past Surgical History:  Procedure Laterality Date  . ABDOMINAL SURGERY    . HERNIA REPAIR    . KNEE SURGERY    . LEG SURGERY  05/22/2018   skin graft  . SPINAL FUSION  2017  . thigh surgery  left   Past Medical History:  Diagnosis Date  . Clotting disorder (HCC)   . Gout   . Headache   . Hypertension   . Neck pain   . Neuropathy    feet  . Sleep apnea   . Thyroid disease    BP 139/84   Pulse 73   Temp 98.3 F (36.8 C)   Ht 6\' 2"  (1.88 m)   Wt (!) 325 lb 6.4 oz (147.6 kg)   SpO2 96%   BMI 41.78 kg/m   Opioid Risk Score:   Fall Risk Score:  `1  Depression screen PHQ 2/9  Depression screen [facility] 2/9 07/01/2020 06/03/2020  Decreased Interest 1 1  Down, Depressed, Hopeless 1 1  PHQ - 2 Score 2 2  Altered sleeping - 2  Tired, decreased energy - 2  Change in appetite - 2  Feeling bad or failure about yourself  - 3  Trouble concentrating - 1  Moving slowly or fidgety/restless - 2  Suicidal thoughts - 0  PHQ-9 Score - 14    Review of Systems   Musculoskeletal:       Spasms Left hip Left thigh lateral Right knee Right thigh  Neurological: Positive for tremors, weakness and numbness.       Tingling  All other systems reviewed and are negative.      Objective:   Physical Exam Vitals reviewed.  Constitutional:      Appearance: He is obese.  HENT:     Head: Normocephalic and atraumatic.  Eyes:     Extraocular Movements: Extraocular movements intact.     Conjunctiva/sclera: Conjunctivae normal.     Pupils: Pupils are equal, round, and reactive to light.  Skin:    General: Skin is warm and dry.     Comments: Thickened scar over the left lateral hip with a 1 x 2 cm abrasion no bleeding or discharge.  Neurological:     Mental Status: He is alert and oriented to person, place, and time.     Comments: Decreased sensation over the lateral thighs bilaterally  Psychiatric:        Mood and Affect: Mood normal.        Behavior: Behavior normal.           Assessment & Plan:  #1.  Neurogenic pain meralgia paresthetica bilateral thighs.  This is due to extensive soft tissue injury left greater than right side.  We discussed that his scar is an elastic and will cause a pulling sensation when he moves.  We discussed that loosefitting clothing would be helpful for him rather than wearing the belt that tends to run over the scarred area.  Weight loss would be helpful as well. We discussed medication management, he is continue to work full-time. Lyrica was not helpful at a low dose, higher doses may cause excessive drowsiness which could affect his work activities. We will trial duloxetine 30 mg p.o. daily for 1 month return to clinic may need to increase to 60 at next visit if still ineffective.

## 2020-07-01 NOTE — Patient Instructions (Signed)
Duloxetine Delayed-Release Capsules What is this medicine? DULOXETINE (doo LOX e teen) is used to treat depression, anxiety, and different types of chronic pain. This medicine may be used for other purposes; ask your health care provider or pharmacist if you have questions. COMMON BRAND NAME(S): Cymbalta, Drizalma, Irenka What should I tell my health care provider before I take this medicine? They need to know if you have any of these conditions:  bipolar disorder  glaucoma  high blood pressure  kidney disease  liver disease  seizures  suicidal thoughts, plans or attempt; a previous suicide attempt by you or a family member  take medicines that treat or prevent blood clots  taken medicines called MAOIs like Carbex, Eldepryl, Marplan, Nardil, and Parnate within 14 days  trouble passing urine  an unusual reaction to duloxetine, other medicines, foods, dyes, or preservatives  pregnant or trying to get pregnant  breast-feeding How should I use this medicine? Take this medicine by mouth with a glass of water. Follow the directions on the prescription label. Do not crush, cut or chew some capsules of this medicine. Some capsules may be opened and sprinkled on applesauce. Check with your doctor or pharmacist if you are not sure. You can take this medicine with or without food. Take your medicine at regular intervals. Do not take your medicine more often than directed. Do not stop taking this medicine suddenly except upon the advice of your doctor. Stopping this medicine too quickly may cause serious side effects or your condition may worsen. A special MedGuide will be given to you by the pharmacist with each prescription and refill. Be sure to read this information carefully each time. Talk to your pediatrician regarding the use of this medicine in children. While this drug may be prescribed for children as young as 7 years of age for selected conditions, precautions do  apply. Overdosage: If you think you have taken too much of this medicine contact a poison control center or emergency room at once. NOTE: This medicine is only for you. Do not share this medicine with others. What if I miss a dose? If you miss a dose, take it as soon as you can. If it is almost time for your next dose, take only that dose. Do not take double or extra doses. What may interact with this medicine? Do not take this medicine with any of the following medications:  desvenlafaxine  levomilnacipran  linezolid  MAOIs like Carbex, Eldepryl, Emsam, Marplan, Nardil, and Parnate  methylene blue (injected into a vein)  milnacipran  safinamide  thioridazine  venlafaxine  viloxazine This medicine may also interact with the following medications:  alcohol  amphetamines  aspirin and aspirin-like medicines  certain antibiotics like ciprofloxacin and enoxacin  certain medicines for blood pressure, heart disease, irregular heart beat  certain medicines for depression, anxiety, or psychotic disturbances  certain medicines for migraine headache like almotriptan, eletriptan, frovatriptan, naratriptan, rizatriptan, sumatriptan, zolmitriptan  certain medicines that treat or prevent blood clots like warfarin, enoxaparin, and dalteparin  cimetidine  fentanyl  lithium  NSAIDS, medicines for pain and inflammation, like ibuprofen or naproxen  phentermine  procarbazine  rasagiline  sibutramine  St. John's wort  theophylline  tramadol  tryptophan This list may not describe all possible interactions. Give your health care provider a list of all the medicines, herbs, non-prescription drugs, or dietary supplements you use. Also tell them if you smoke, drink alcohol, or use illegal drugs. Some items may interact with your medicine. What   should I watch for while using this medicine? Tell your doctor if your symptoms do not get better or if they get worse. Visit your  doctor or healthcare provider for regular checks on your progress. Because it may take several weeks to see the full effects of this medicine, it is important to continue your treatment as prescribed by your doctor. This medicine may cause serious skin reactions. They can happen weeks to months after starting the medicine. Contact your healthcare provider right away if you notice fevers or flu-like symptoms with a rash. The rash may be red or purple and then turn into blisters or peeling of the skin. Or, you might notice a red rash with swelling of the face, lips, or lymph nodes in your neck or under your arms. Patients and their families should watch out for new or worsening thoughts of suicide or depression. Also watch out for sudden changes in feelings such as feeling anxious, agitated, panicky, irritable, hostile, aggressive, impulsive, severely restless, overly excited and hyperactive, or not being able to sleep. If this happens, especially at the beginning of treatment or after a change in dose, call your healthcare provider. You may get drowsy or dizzy. Do not drive, use machinery, or do anything that needs mental alertness until you know how this medicine affects you. Do not stand or sit up quickly, especially if you are an older patient. This reduces the risk of dizzy or fainting spells. Alcohol may interfere with the effect of this medicine. Avoid alcoholic drinks. This medicine can cause an increase in blood pressure. This medicine can also cause a sudden drop in your blood pressure, which may make you feel faint and increase the chance of a fall. These effects are most common when you first start the medicine or when the dose is increased, or during use of other medicines that can cause a sudden drop in blood pressure. Check with your doctor for instructions on monitoring your blood pressure while taking this medicine. Your mouth may get dry. Chewing sugarless gum or sucking hard candy, and drinking  plenty of water, may help. Contact your doctor if the problem does not go away or is severe. What side effects may I notice from receiving this medicine? Side effects that you should report to your doctor or health care professional as soon as possible:  allergic reactions like skin rash, itching or hives, swelling of the face, lips, or tongue  anxious  breathing problems  confusion  changes in vision  chest pain  confusion  elevated mood, decreased need for sleep, racing thoughts, impulsive behavior  eye pain  fast, irregular heartbeat  feeling faint or lightheaded, falls  feeling agitated, angry, or irritable  hallucination, loss of contact with reality  high blood pressure  loss of balance or coordination  palpitations  redness, blistering, peeling or loosening of the skin, including inside the mouth  restlessness, pacing, inability to keep still  seizures  stiff muscles  suicidal thoughts or other mood changes  trouble passing urine or change in the amount of urine  trouble sleeping  unusual bleeding or bruising  unusually weak or tired  vomiting  yellowing of the eyes or skin Side effects that usually do not require medical attention (report to your doctor or health care professional if they continue or are bothersome):  change in sex drive or performance  change in appetite or weight  constipation  dizziness  dry mouth  headache  increased sweating  nausea  tired   This list may not describe all possible side effects. Call your doctor for medical advice about side effects. You may report side effects to FDA at 1-800-FDA-1088. Where should I keep my medicine? Keep out of the reach of children and pets. Store at room temperature between 15 and 30 degrees C (59 to 86 degrees F). Get rid of any unused medicine after the expiration date. To get rid of medicines that are no longer needed or have expired:  Take the medicine to a medicine  take-back program. Check with your pharmacy or law enforcement to find a location.  If you cannot return the medicine, check the label or package insert to see if the medicine should be thrown out in the garbage or flushed down the toilet. If you are not sure, ask your health care provider. If it is safe to put it in the trash, take the medicine out of the container. Mix the medicine with cat litter, dirt, coffee grounds, or other unwanted substance. Seal the mixture in a bag or container. Put it in the trash. NOTE: This sheet is a summary. It may not cover all possible information. If you have questions about this medicine, talk to your doctor, pharmacist, or health care provider.  2021 Elsevier/Gold Standard (2020-02-14 16:06:16)  

## 2020-07-02 ENCOUNTER — Encounter: Payer: Self-pay | Admitting: Urology

## 2020-07-02 ENCOUNTER — Ambulatory Visit (INDEPENDENT_AMBULATORY_CARE_PROVIDER_SITE_OTHER): Payer: No Typology Code available for payment source | Admitting: Urology

## 2020-07-02 VITALS — BP 148/87 | HR 71 | Temp 98.6°F | Ht 76.0 in | Wt 325.0 lb

## 2020-07-02 DIAGNOSIS — N486 Induration penis plastica: Secondary | ICD-10-CM

## 2020-07-02 MED ORDER — AMBULATORY NON FORMULARY MEDICATION
3 refills | Status: AC
Start: 2020-07-02 — End: ?

## 2020-07-02 NOTE — Progress Notes (Signed)
Urological Symptom Review  Patient is experiencing the following symptoms: Erection problems (male only) Penile pain (male only)    Review of Systems  Gastrointestinal (upper)  : Negative for upper GI symptoms  Gastrointestinal (lower) : Negative for lower GI symptoms  Constitutional : Negative for symptoms  Skin: Negative for skin symptoms  Eyes: Negative for eye symptoms  Ear/Nose/Throat : Negative for Ear/Nose/Throat symptoms  Hematologic/Lymphatic: Negative for Hematologic/Lymphatic symptoms  Cardiovascular : Negative for cardiovascular symptoms  Respiratory : Negative for respiratory symptoms  Endocrine: Negative for endocrine symptoms  Musculoskeletal: Negative  Neurological: Negative for neurological symptoms  Psychologic: Negative for psychiatric symptoms

## 2020-07-02 NOTE — Progress Notes (Signed)
07/02/2020 2:40 PM   Derrick Ravel 07-15-1960 [id number]  Referring provider: Estanislado Pandy, MD [address] [place],  [place] [address]  followup peyronies disease  HPI: Mr Shimabukuro is a 60yo here for followup for peyronies disease. He has noted no change in his curvature over the past 3 months. No pain with erections. He has 45 degree dorsal curvature at the mid penile shaft based on pictures provided   PMH: Past Medical History:  Diagnosis Date  . Clotting disorder (HCC)   . Gout   . Headache   . Hypertension   . Neck pain   . Neuropathy    feet  . Sleep apnea   . Thyroid disease     Surgical History: Past Surgical History:  Procedure Laterality Date  . ABDOMINAL SURGERY    . HERNIA REPAIR    . KNEE SURGERY    . LEG SURGERY  05/22/2018   skin graft  . SPINAL FUSION  2017  . thigh surgery  left    Home Medications:  Allergies as of 07/02/2020      Reactions   Penicillins Anaphylaxis   Tongue close up and swelling      Medication List       Accurate as of July 02, 2020  2:40 PM. If you have any questions, ask your nurse or doctor.        allopurinol 300 MG tablet Commonly known as: ZYLOPRIM Take 300 mg by mouth daily.   aspirin 81 MG tablet Take 81 mg by mouth daily.   colchicine 0.3 MG tablet Take 0.6 mg by mouth daily.   DULoxetine 30 MG capsule Commonly known as: CYMBALTA Take 1 capsule (30 mg total) by mouth daily.   gabapentin 400 MG capsule Commonly known as: NEURONTIN Take by mouth.   gabapentin 100 MG capsule Commonly known as: NEURONTIN Take 100 mg by mouth 3 (three) times daily.   hydrOXYzine 10 MG tablet Commonly known as: ATARAX/VISTARIL Take 10 mg by mouth 3 (three) times daily.   lisinopril 20 MG tablet Commonly known as: ZESTRIL Take 20 mg by mouth daily.   losartan 100 MG tablet Commonly known as: COZAAR Take 100 mg by mouth daily.   losartan-hydrochlorothiazide 100-12.5 MG tablet Commonly known as: HYZAAR Take  1 tablet by mouth daily.   methocarbamol 500 MG tablet Commonly known as: ROBAXIN   pentoxifylline 400 MG CR tablet Commonly known as: TRENTAL Take 1 tablet (400 mg total) by mouth in the morning and at bedtime.   pregabalin 50 MG capsule Commonly known as: LYRICA Take 1 capsule (50 mg total) by mouth 2 (two) times daily.   Synthroid 175 MCG tablet Generic drug: levothyroxine Take 175 mcg by mouth daily.   Xarelto 20 MG Tabs tablet Generic drug: rivaroxaban Take 20 mg by mouth daily.       Allergies:  Allergies  Allergen Reactions  . Penicillins Anaphylaxis    Tongue close up and swelling    Family History: Family History  Problem Relation Age of Onset  . Stroke Father   . Pancreatic cancer Father     Social History:  reports that he quit smoking about 37 years ago. He has a 1.00 pack-year smoking history. His smokeless tobacco use includes chew. He reports current alcohol use. No history on file for drug use.  ROS: All other review of systems were reviewed and are negative except what is noted above in HPI  Physical Exam: BP (!) 148/87  Pulse 71   Temp 98.6 F (37 C)   Ht 6\' 4"  (1.93 m)   Wt (!) 325 lb (147.4 kg)   BMI 39.56 kg/m   Constitutional:  Alert and oriented, No acute distress. HEENT: [place] AT, moist mucus membranes.  Trachea midline, no masses. Cardiovascular: No clubbing, cyanosis, or edema. Respiratory: Normal respiratory effort, no increased work of breathing. GI: Abdomen is soft, nontender, nondistended, no abdominal masses GU: No CVA tenderness.  Lymph: No cervical or inguinal lymphadenopathy. Skin: No rashes, bruises or suspicious lesions. Neurologic: Grossly intact, no focal deficits, moving all 4 extremities. Psychiatric: Normal mood and affect.  Laboratory Data: No results found for: WBC, HGB, HCT, MCV, PLT  No results found for: CREATININE  No results found for: PSA  No results found for: TESTOSTERONE  No results found for:  HGBA1C  Urinalysis No results found for: COLORURINE, APPEARANCEUR, LABSPEC, PHURINE, GLUCOSEU, HGBUR, BILIRUBINUR, KETONESUR, PROTEINUR, UROBILINOGEN, NITRITE, LEUKOCYTESUR  No results found for: LABMICR, WBCUA, RBCUA, LABEPIT, MUCUS, BACTERIA  Pertinent Imaging:  No results found for this or any previous visit.  No results found for this or any previous visit.  No results found for this or any previous visit.  No results found for this or any previous visit.  No results found for this or any previous visit.  No results found for this or any previous visit.  No results found for this or any previous visit.  No results found for this or any previous visit.   Assessment & Plan:    1. Peyronie disease We discussed the management of peyronies disease including medical therapy, penile plication, verapamil therapy and xiaflex therapy. After discussed the options the patient elects for xiaflex therapy  No follow-ups on file.  , MD  [facility] [place]

## 2020-07-08 ENCOUNTER — Encounter: Payer: Self-pay | Admitting: Urology

## 2020-07-08 NOTE — Patient Instructions (Signed)
Collagenase injection (Dupuytren's Contracture/Peyronie's Disease) What is this medicine? COLLAGENASE (kohl LAH jen ace) is used to treat Dupuytren's contracture. This medicine may help straighten a bent finger by breaking up hard tissue. It is also used for Peyronie's disease by breaking up the hard tissue plaque that causes the curvature in the penis. This medicine may be used for other purposes; ask your health care provider or pharmacist if you have questions. COMMON BRAND NAME(S): Xiaflex What should I tell my health care provider before I take this medicine? They need to know if you have any of these conditions:  hemophilia  low platelet counts  take medicines that treat or prevent blood clots  an unusual or allergic reaction to collagenase, other medicines, foods, dyes, or preservatives  pregnant or trying to get pregnant  breast-feeding How should I use this medicine? This medicine is for injection into the hand or penis. It is given by a health care professional in a hospital or clinic setting. A special MedGuide will be given to you by the pharmacist with each prescription and refill. Be sure to read this information carefully each time. Talk to your pediatrician regarding the use of this medicine in children. Special care may be needed. Overdosage: If you think you have taken too much of this medicine contact a poison control center or emergency room at once. NOTE: This medicine is only for you. Do not share this medicine with others. What if I miss a dose? It is important not to miss your dose. Call your doctor or health care professional if you are unable to keep an appointment. What may interact with this medicine?  aspirin and aspirin-like medicines  certain medicines that treat or prevent blood clots like warfarin, enoxaparin, and dalteparin This list may not describe all possible interactions. Give your health care provider a list of all the medicines, herbs,  non-prescription drugs, or dietary supplements you use. Also tell them if you smoke, drink alcohol, or use illegal drugs. Some items may interact with your medicine. What should I watch for while using this medicine? Your condition will be monitored carefully while you are receiving this medicine. If being treated for Dupuytren's contracture, return to your healthcare provider the day after your hand is injected. In the meantime, do not flex or extend the fingers of your hand that was injected. Do not touch your finger that was injected, and elevate your hand until bedtime. Do not perform activity with the injected hand until you are told that it is OK. Follow any instructions about wearing a splint or performing finger exercises. Also, call your healthcare provider if you get increasing redness or swelling in the hand, if you have numbness or tingling in the treated finger, or if you have trouble bending the finger after the swelling goes down. If being treated for Peyronie's disease, you will need to return to your healthcare provider for a manual procedure that will stretch and help straighten your penis. Also, your healthcare provider will show you how to gently stretch your penis at home. Do not resume sexual activity until you are told that it is okay. Follow instructions on when to return for follow-up visits. Immediately call your doctor if you have trouble stretching or straightening your penis, or if you have pain or other concerns. Immediately call your healthcare provider if you get a fever or chills. What side effects may I notice from receiving this medicine? Side effects that you should report to your doctor or health   care professional as soon as possible:  allergic reactions like skin rash, itching or hives, swelling of the face, lips, or tongue  chest pain or palpitations  infection (fever, chills, increasing redness or swelling at treated site)  numbness or tingling in a treated  hand  popping sound or sensation in an erect penis  sudden back pain or trouble walking  sudden loss of the ability to maintain an erection  trouble breathing  trouble urinating or blood in the urine  unusual pain, swelling, or bruising of the penis Side effects that usually do not require medical attention (report to your doctor or health care professional if they continue or are bothersome):  bleeding or bruising at site where injected  irritation at site where injected  mild pain or swelling at site where injected This list may not describe all possible side effects. Call your doctor for medical advice about side effects. You may report side effects to FDA at 1-800-FDA-1088. Where should I keep my medicine? This drug is given in a hospital or clinic and will not be stored at home. NOTE: This sheet is a summary. It may not cover all possible information. If you have questions about this medicine, talk to your doctor, pharmacist, or health care provider.  2021 Elsevier/Gold Standard (2019-07-27 17:03:04)  

## 2020-07-31 ENCOUNTER — Encounter: Payer: Self-pay | Admitting: Physical Medicine & Rehabilitation

## 2020-07-31 ENCOUNTER — Encounter
Payer: No Typology Code available for payment source | Attending: Physical Medicine & Rehabilitation | Admitting: Physical Medicine & Rehabilitation

## 2020-07-31 ENCOUNTER — Other Ambulatory Visit: Payer: Self-pay

## 2020-07-31 VITALS — BP 128/79 | HR 89 | Temp 98.3°F | Ht 76.0 in | Wt 320.2 lb

## 2020-07-31 DIAGNOSIS — G5713 Meralgia paresthetica, bilateral lower limbs: Secondary | ICD-10-CM | POA: Insufficient documentation

## 2020-07-31 DIAGNOSIS — G5780 Other specified mononeuropathies of unspecified lower limb: Secondary | ICD-10-CM | POA: Insufficient documentation

## 2020-07-31 DIAGNOSIS — G588 Other specified mononeuropathies: Secondary | ICD-10-CM | POA: Insufficient documentation

## 2020-07-31 MED ORDER — TRAMADOL HCL 50 MG PO TABS
50.0000 mg | ORAL_TABLET | Freq: Two times a day (BID) | ORAL | 0 refills | Status: DC | PRN
Start: 2020-07-31 — End: 2020-09-19

## 2020-07-31 MED ORDER — DULOXETINE HCL 60 MG PO CPEP: 60.0000 mg | ORAL_CAPSULE | Freq: Every day | ORAL | 0 refills | Status: DC

## 2020-07-31 NOTE — Progress Notes (Signed)
Subjective:    Patient ID: Scott Preston, male    DOB: 16-Jul-1960, 60 y.o.   MRN: [id number]  HPI 60 year old male with extensive soft tissue injury left greater than right hip with chronic burning pain in the left lateral thigh.  He has been on gabapentin 900 mg 3 times daily on a chronic basis he notices when he misses a dose.  He was tried briefly on low-dose pregabalin 50 mg twice daily but then switched to duloxetine 30 mg a day for the last month.  He has not noted any improvement in his pain following the start of duloxetine.  He plans to see his plastic surgeon tomorrow.  He still has a small open area over the scar on the left anterior lateral hip area. Continues to work full-time at [employer] job.  His pain is worsened when he leans forward in a sitting position and improves when he leans back.  Pain Inventory Average Pain 3 Pain Right Now 3 My pain is constant, burning, stabbing and tingling  In the last 24 hours, has pain interfered with the following? General activity 4 Relation with others 3 Enjoyment of life 5 What TIME of day is your pain at its worst? daytime Sleep (in general) Fair  Pain is worse with: walking, bending, sitting and some activites Pain improves with: sleep Relief from Meds: No pain medication taken  Family History  Problem Relation Age of Onset  . Stroke Father   . Pancreatic cancer Father    Social History   Socioeconomic History  . Marital status: Married    Spouse name: Not on file  . Number of children: 3  . Years of education: Not on file  . Highest education level: Not on file  Occupational History  . Occupation: Scientist, research (medical)  Tobacco Use  . Smoking status: Former Smoker    Packs/day: 0.50    Years: 2.00    Pack years: 1.00    Quit date: 04/03/1983    Years since quitting: 37.3  . Smokeless tobacco: Current User    Types: Chew  Vaping Use  . Vaping Use: Never used  Substance and Sexual Activity  . Alcohol use: Yes     Comment: occasionally beer  . Drug use: Not on file  . Sexual activity: Not on file  Other Topics Concern  . Not on file  Social History Narrative  . Not on file   Social Determinants of Health   Financial Resource Strain: Not on file  Food Insecurity: Not on file  Transportation Needs: Not on file  Physical Activity: Not on file  Stress: Not on file  Social Connections: Not on file   Past Surgical History:  Procedure Laterality Date  . ABDOMINAL SURGERY    . HERNIA REPAIR    . KNEE SURGERY    . LEG SURGERY  05/22/2018   skin graft  . SPINAL FUSION  2017  . thigh surgery  left   Past Surgical History:  Procedure Laterality Date  . ABDOMINAL SURGERY    . HERNIA REPAIR    . KNEE SURGERY    . LEG SURGERY  05/22/2018   skin graft  . SPINAL FUSION  2017  . thigh surgery  left   Past Medical History:  Diagnosis Date  . Clotting disorder (HCC)   . Gout   . Headache   . Hypertension   . Neck pain   . Neuropathy    feet  . Sleep apnea   .  Thyroid disease    BP 128/79   Pulse 89   Temp 98.3 F (36.8 C)   Ht 6\' 4"  (1.93 m)   Wt (!) 320 lb 3.2 oz (145.2 kg)   SpO2 94%   BMI 38.98 kg/m   Opioid Risk Score:   Fall Risk Score:  `1  Depression screen PHQ 2/9  Depression screen [facility] 2/9 07/01/2020 06/03/2020  Decreased Interest 1 1  Down, Depressed, Hopeless 1 1  PHQ - 2 Score 2 2  Altered sleeping - 2  Tired, decreased energy - 2  Change in appetite - 2  Feeling bad or failure about yourself  - 3  Trouble concentrating - 1  Moving slowly or fidgety/restless - 2  Suicidal thoughts - 0  PHQ-9 Score - 14   Review of Systems  Musculoskeletal: Positive for gait problem.       Left waist pain & upper leg pain  All other systems reviewed and are negative.      Objective:   Physical Exam Vitals and nursing note reviewed.  Constitutional:      Appearance: He is obese.  HENT:     Head: Normocephalic and atraumatic.  Eyes:     Extraocular Movements:  Extraocular movements intact.     Conjunctiva/sclera: Conjunctivae normal.     Pupils: Pupils are equal, round, and reactive to light.  Musculoskeletal:     Comments: Patient has large soft tissue defect anterior lateral thigh on the left side with healed skin graft 1 open area at the superior aspect of the same   Skin:    General: Skin is warm and dry.  Neurological:     Mental Status: He is alert and oriented to person, place, and time.     Comments: Hypersensitivity to touch left lateral thigh Decreased light touch sensationin the left inguinal area  No evidence of thigh atrophy.  Ambulates without assistive device no toe drag or knee instability  Psychiatric:        Mood and Affect: Mood normal.        Behavior: Behavior normal.           Assessment & Plan:  #1.  Neuropathic pain primarily in the left anterior lateral thigh following extensive soft tissue injury.  Lateral femoral cutaneous nerve and iliohypogastric nerves are likely both involved.  Continue gabapentin 900 mg 3 times daily Increase duloxetine to 60 mg a day Return to clinic 1 month if nerve pain is no better may consider higher dose of pregabalin and discontinuing duloxetine.

## 2020-08-08 ENCOUNTER — Telehealth: Payer: Self-pay

## 2020-08-08 ENCOUNTER — Ambulatory Visit: Payer: No Typology Code available for payment source | Admitting: Physical Medicine & Rehabilitation

## 2020-08-08 NOTE — Telephone Encounter (Signed)
Patient called today to cancel appointments for upcoming Xiaflex.   Per patient, insurance and cost of medications for Xiaflex was not what the patient would be able to spend.  MD sent message.

## 2020-08-13 ENCOUNTER — Ambulatory Visit: Payer: No Typology Code available for payment source | Admitting: Urology

## 2020-08-15 ENCOUNTER — Ambulatory Visit: Payer: No Typology Code available for payment source | Admitting: Urology

## 2020-08-20 ENCOUNTER — Ambulatory Visit: Payer: No Typology Code available for payment source | Admitting: Urology

## 2020-08-29 ENCOUNTER — Ambulatory Visit: Payer: No Typology Code available for payment source | Admitting: Physical Medicine & Rehabilitation

## 2020-09-19 ENCOUNTER — Encounter: Payer: Self-pay | Admitting: Physical Medicine & Rehabilitation

## 2020-09-19 ENCOUNTER — Encounter
Payer: No Typology Code available for payment source | Attending: Physical Medicine & Rehabilitation | Admitting: Physical Medicine & Rehabilitation

## 2020-09-19 ENCOUNTER — Other Ambulatory Visit: Payer: Self-pay

## 2020-09-19 VITALS — BP 140/82 | HR 71 | Temp 98.3°F | Ht 76.0 in | Wt 320.8 lb

## 2020-09-19 DIAGNOSIS — G588 Other specified mononeuropathies: Secondary | ICD-10-CM | POA: Insufficient documentation

## 2020-09-19 DIAGNOSIS — G5713 Meralgia paresthetica, bilateral lower limbs: Secondary | ICD-10-CM

## 2020-09-19 MED ORDER — TIZANIDINE HCL 2 MG PO TABS: 4.0000 mg | ORAL_TABLET | Freq: Three times a day (TID) | ORAL | 1 refills | Status: DC | PRN

## 2020-09-19 MED ORDER — DULOXETINE HCL 60 MG PO CPEP: 60.0000 mg | ORAL_CAPSULE | Freq: Every day | ORAL | 5 refills | Status: AC

## 2020-09-19 NOTE — Progress Notes (Signed)
Subjective:    Patient ID: Scott Preston, male    DOB: 07-Mar-1961, 60 y.o.   MRN: [id number] Crush injury at work resulting in [place] injury in May 2019 primary to left lateral hip with multiple infections and debridement, wound vac and eventually skin graft.  Patient also had a small area in the right posterior thigh.  Hospitalized and treated at [facility], treated and settled workers comp injury now seen at [place].  Walking and sitting affects the Left hip, walking affects the right posterior thigh.  The pain is described as tingly.  He has been treated with gabapentin 900 mg 3 times a day.  He notices when he does not take the medicine on time or skips a couple doses his pain increases significantly.  He has not been taking narcotic analgesics.  He has not tried any topical medications. The patient has returned to work and has a sedentary position.  He has gained at least 20 pounds post injury. HPI 60 yo male with meralgia paresthetica BLE who returns for follow up of med changes.  Duloxetine increased to 60mg  daily with improvement of constant burning pain.  The pt stil has intermittent spasms in back of thighs has been on methocarbamol for a couple years but this does not seem to help.  Has not tried other muscle relaxers.  Also does not do any hamstring stretches Works in position but does weedeating and mowing at home which aggravates pain  Pain Inventory Average Pain 3 Pain Right Now 3 My pain is sharp, stabbing, and tingling  In the last 24 hours, has pain interfered with the following? General activity 4 Relation with others 3 Enjoyment of life 4 What TIME of day is your pain at its worst? daytime Sleep (in general) Fair  Pain is worse with: walking, bending, and sitting Pain improves with: rest and medication Relief from Meds: 2  Family History  Problem Relation Age of Onset   Stroke Father    Pancreatic cancer Father    Social History   Socioeconomic History    Marital status: Married    Spouse name: Not on file   Number of children: 3   Years of education: Not on file   Highest education level: Not on file  Occupational History   Occupation: [employer]  Tobacco Use   Smoking status: Former    Packs/day: 0.50    Years: 2.00    Pack years: 1.00    Types: Cigarettes    Quit date: 04/03/1983    Years since quitting: 37.4   Smokeless tobacco: Current    Types: Chew  Vaping Use   Vaping Use: Never used  Substance and Sexual Activity   Alcohol use: Yes    Comment: occasionally beer   Drug use: Not on file   Sexual activity: Not on file  Other Topics Concern   Not on file  Social History Narrative   Not on file   Social Determinants of Health   Financial Resource Strain: Not on file  Food Insecurity: Not on file  Transportation Needs: Not on file  Physical Activity: Not on file  Stress: Not on file  Social Connections: Not on file   Past Surgical History:  Procedure Laterality Date   ABDOMINAL SURGERY     HERNIA REPAIR     KNEE SURGERY     LEG SURGERY  05/22/2018   skin graft   SPINAL FUSION  2017   thigh surgery  left  Past Surgical History:  Procedure Laterality Date   ABDOMINAL SURGERY     HERNIA REPAIR     KNEE SURGERY     LEG SURGERY  05/22/2018   skin graft   SPINAL FUSION  2017   thigh surgery  left   Past Medical History:  Diagnosis Date   Clotting disorder (HCC)    Gout    Headache    Hypertension    Neck pain    Neuropathy    feet   Sleep apnea    Thyroid disease    BP 140/82 (BP Location: Right Arm, Patient Position: Sitting, Cuff Size: Large)   Pulse 71   Temp 98.3 F (36.8 C)   Ht 6\' 4"  (1.93 m)   Wt (!) 320 lb 12.8 oz (145.5 kg)   SpO2 98%   BMI 39.05 kg/m   Opioid Risk Score:   Fall Risk Score:  `1  Depression screen PHQ 2/9  Depression screen [facility] 2/9 07/31/2020 07/01/2020 06/03/2020  Decreased Interest 0 1 1  Down, Depressed, Hopeless - 1 1  PHQ - 2 Score 0 2 2   Altered sleeping - - 2  Tired, decreased energy - - 2  Change in appetite - - 2  Feeling bad or failure about yourself  - - 3  Trouble concentrating - - 1  Moving slowly or fidgety/restless - - 2  Suicidal thoughts - - 0  PHQ-9 Score - - 14      Review of Systems  Constitutional: Negative.   HENT: Negative.    Eyes: Negative.   Respiratory: Negative.    Cardiovascular: Negative.   Gastrointestinal: Negative.   Endocrine: Negative.   Genitourinary: Negative.   Musculoskeletal:  Positive for gait problem.       Right knee pain , hip pain ,   Skin: Negative.   Allergic/Immunologic: Negative.   Hematological: Negative.   Psychiatric/Behavioral: Negative.        Objective:   Physical Exam Constitutional:      Appearance: He is obese.  Eyes:     Extraocular Movements: Extraocular movements intact.     Conjunctiva/sclera: Conjunctivae normal.     Pupils: Pupils are equal, round, and reactive to light.  Musculoskeletal:     Comments: No pain with passive knee ext, no contracture  Mild tenderness over lateral hamstrings bilaterally  Skin:    Comments: Left hip extensive scarring around skin graft, no hypersensitivity to touch, small 1.5 cm oval open area at superior aspect just posterior to L ASIS  Neurological:     Mental Status: He is alert and oriented to person, place, and time.  Psychiatric:        Mood and Affect: Mood normal.        Behavior: Behavior normal.          Assessment & Plan:   Meralgia paresthetica BLE cont duloxetine 60mg  per day as well as Gabapentin 900mg  TID Bilateral chronic lateral hamstring strain with intermittent spasms , d/c methocarbamol and trial Tizanidine 2mg  q  8h prn May use tramadol 100mg  prn breakthrough pain, 50mg  dose not helpful, this is mainly after mowing RTC 2 mo

## 2020-09-19 NOTE — Patient Instructions (Signed)
Hamstring Strain Rehab Ask your health care provider which exercises are safe for you. Do exercises exactly as told by your health care provider and adjust them as directed. It is normal to feel mild stretching, pulling, tightness, or discomfort as you do these exercises. Stop right away if you feel sudden pain or your pain gets worse. Do not begin these exercises until told by your health care provider. Stretching and range-of-motion exercises These exercises warm up your muscles and joints and improve the movement and flexibility of your thighs. These exercises also help to relieve pain, numbness, and tingling. Talk to your health care provider about theserestrictions. Knee extension, seated  Sit with your left / right heel propped on a chair, a coffee table, or a footstool. Do not have anything under your knee to support it. Allow your leg muscles to relax, letting gravity straighten out your knee (extension). You should feel a stretch behind your left / right knee. If told by your health care provider, deepen the stretch by placing a __________ weight on your thigh, just above your kneecap. Hold this position for __________ seconds. Repeat __________ times. Complete this exercise __________ times a day. Seated stretch This exercise is sometimes called hamstrings and adductors stretch. Sit on the floor with your legs stretched wide. Keep your knees straight during this exercise. Keeping your head and back in a straight line, bend at your waist to reach for your left foot (position A). You should feel a stretch in your right inner thigh (adductors). Hold this position for __________ seconds. Then slowly return to the upright position. Keeping your head and back in a straight line, bend at your waist to reach forward (position B). You should feel a stretch behind both of your thighs or knees (hamstrings). Hold this position for __________ seconds. Then slowly return to the upright  position. Keeping your head and back in a straight line, bend at your waist to reach for your right foot (position C). You should feel a stretch in your left inner thigh (adductors). Hold this position for __________ seconds. Then slowly return to the upright position. Repeat __________ times. Complete this exercise __________ times a day. Hamstrings stretch, supine  Lie on your back (supine position). Loop a belt or towel over the ball of your left / right foot. The ball of your foot is on the walking surface, right under your toes. Straighten your left / right knee and slowly pull on the belt or towel to raise your leg. Do not let your left / right knee bend while you do this. Keep your other leg flat on the floor. Raise the left / right leg until you feel a gentle stretch behind your left / right knee or thigh (hamstrings). Hold this position for __________ seconds. Slowly return your leg to the starting position. Repeat __________ times. Complete this exercise __________ times a day. Strengthening exercises These exercises build strength and endurance in your thighs. Endurance is theability to use your muscles for a long time, even after they get tired. Straight leg raises, prone This exercise strengthens the muscles that move the hips (hip extensors). Lie on your abdomen on a firm surface (prone position). Tense the muscles in your buttocks and lift your left / right leg about 4 inches (10 cm). Keep your knee straight as you lift your leg. If you cannot lift your leg that high without arching your back, place a pillow under your hips. Hold the position for __________ seconds. Slowly  lower your leg to the starting position. Allow your muscles to relax completely before you start the next repetition. Repeat __________ times. Complete this exercise __________ times a day. Bridge This exercise strengthens the muscles in your buttocks and the back of your thighs (hip extensors). Lie on  your back on a firm surface with your knees bent and your feet flat on the floor. Tighten your buttocks muscles and lift your bottom off the floor until the trunk of your body is level with your thighs. You should feel the muscles working in your buttocks and the back of your thighs. Do not arch your back. Hold this position for __________ seconds. Slowly lower your hips to the starting position. Let your buttocks muscles relax completely between repetitions. If told by your health care provider, keep your bottom lifted off the floor while you slowly walk your feet away from you as far as you can control. Hold for __________ seconds, then slowly walk your feet back toward you. Repeat __________ times. Complete this exercise __________ times a day. Lateral walking with band This is an exercise in which you walk sideways (lateral), with tension provided by an exercise band. The exercise strengthens the muscles in your hip (hip abductors). Stand in a long hallway. Wrap a loop of exercise band around your legs, just above your knees. Bend your knees gently and drop your hips down and back so your weight is over your heels. Step to the side to move down the length of the hallway, keeping your toes pointed ahead of you and keeping tension in the band. Repeat, leading with your other leg. Repeat __________ times. Complete this exercise __________ times a day. Single leg stand with reaching This exercise is also called eccentric hamstring stretch. Stand on your left / right foot. Keep your big toe down on the floor and try to keep your arch lifted. Slowly reach down toward the floor as far as you can while keeping your balance. Lowering your thigh under tension is called eccentric stretching. Hold this position for __________ seconds. Repeat __________ times. Complete this exercise __________ times a day. Plank, prone This exercise strengthens muscles in your abdomen and core area. Lie on your  abdomen on the floor (prone position),and prop yourself up on your elbows. Your hands should be straight out in front of you, and your elbows should be below your shoulders. Position your feet similar to a push-up position so your toes are on the ground. Tighten your abdominal muscles and lift your body off the floor. Do not arch your back. Do not hold your breath. Hold this position for __________ seconds. Repeat __________ times. Complete this exercise __________ times a day. This information is not intended to replace advice given to you by your health care provider. Make sure you discuss any questions you have with your healthcare provider. Document Revised: 07/20/2018 Document Reviewed: 03/27/2018 Elsevier Patient Education  2022 [employer].

## 2020-11-14 ENCOUNTER — Encounter
Payer: No Typology Code available for payment source | Attending: Physical Medicine & Rehabilitation | Admitting: Physical Medicine & Rehabilitation

## 2020-11-14 ENCOUNTER — Other Ambulatory Visit: Payer: Self-pay

## 2020-11-14 ENCOUNTER — Encounter: Payer: Self-pay | Admitting: Physical Medicine & Rehabilitation

## 2020-11-14 VITALS — BP 135/79 | HR 83 | Temp 98.2°F | Ht 76.0 in | Wt 324.8 lb

## 2020-11-14 DIAGNOSIS — Z5181 Encounter for therapeutic drug level monitoring: Secondary | ICD-10-CM | POA: Diagnosis not present

## 2020-11-14 DIAGNOSIS — G5713 Meralgia paresthetica, bilateral lower limbs: Secondary | ICD-10-CM | POA: Diagnosis not present

## 2020-11-14 DIAGNOSIS — G894 Chronic pain syndrome: Secondary | ICD-10-CM | POA: Insufficient documentation

## 2020-11-14 DIAGNOSIS — Z79891 Long term (current) use of opiate analgesic: Secondary | ICD-10-CM | POA: Diagnosis not present

## 2020-11-14 MED ORDER — TIZANIDINE HCL 2 MG PO TABS: 4.0000 mg | ORAL_TABLET | Freq: Three times a day (TID) | ORAL | 5 refills | Status: DC | PRN

## 2020-11-14 MED ORDER — TRAMADOL HCL 50 MG PO TABS: 100.0000 mg | ORAL_TABLET | Freq: Every evening | ORAL | 5 refills | Status: DC

## 2020-11-14 NOTE — Progress Notes (Signed)
Subjective:    Patient ID: Scott Preston, male    DOB: 08-Dec-1960, 60 y.o.   MRN: [id number] Crush injury at work resulting in [place] injury in May 2019 primary to left lateral hip with multiple infections and debridement, wound vac and eventually skin graft.  Patient also had a small area in the right posterior thigh.  Hospitalized and treated at [facility], treated and settled workers comp injury now seen at [place].  Walking and sitting affects the Left hip, walking affects the right posterior thigh.  The pain is described as tingly.  He has been treated with gabapentin 900 mg 3 times a day.  He notices when he does not take the medicine on time or skips a couple doses his pain increases significantly.  He has not been taking narcotic analgesics.  He has not tried any topical medications. The patient has returned to work and has a sedentary position.  He has gained at least 20 pounds post injury. HPI Patient returns today stating that overall the sharp burning pain in the lateral hip area has improved.  He has been on duloxetine 60 mg/day for over 2 months. His hamstring spasms have improved after switching from methocarbamol to tizanidine 2 mg 3 times per day The patient continues on gabapentin 900 mg 3 times daily prescribed by primary care physician His main complaint is activity related pain after work.  We discussed potential treatment options for this. The patient continues to work full-time in a sedentary position Pain Inventory Average Pain 3 Pain Right Now 3 My pain is sharp, stabbing, and aching  In the last 24 hours, has pain interfered with the following? General activity 3 Relation with others 8 Enjoyment of life 6 What TIME of day is your pain at its worst? daytime and evening Sleep (in general) Good  Pain is worse with: bending, sitting, and some activites Pain improves with: rest and medication Relief from Meds: 7  Family History  Problem Relation Age of Onset    Stroke Father    Pancreatic cancer Father    Social History   Socioeconomic History   Marital status: Married    Spouse name: Not on file   Number of children: 3   Years of education: Not on file   Highest education level: Not on file  Occupational History   Occupation: Scientist, research (medical)  Tobacco Use   Smoking status: Former    Packs/day: 0.50    Years: 2.00    Pack years: 1.00    Types: Cigarettes    Quit date: 04/03/1983    Years since quitting: 37.6   Smokeless tobacco: Current    Types: Chew  Vaping Use   Vaping Use: Never used  Substance and Sexual Activity   Alcohol use: Yes    Comment: occasionally beer   Drug use: Not on file   Sexual activity: Not on file  Other Topics Concern   Not on file  Social History Narrative   Not on file   Social Determinants of Health   Financial Resource Strain: Not on file  Food Insecurity: Not on file  Transportation Needs: Not on file  Physical Activity: Not on file  Stress: Not on file  Social Connections: Not on file   Past Surgical History:  Procedure Laterality Date   ABDOMINAL SURGERY     HERNIA REPAIR     KNEE SURGERY     LEG SURGERY  05/22/2018   skin graft   SPINAL FUSION  2017   thigh surgery  left   Past Surgical History:  Procedure Laterality Date   ABDOMINAL SURGERY     HERNIA REPAIR     KNEE SURGERY     LEG SURGERY  05/22/2018   skin graft   SPINAL FUSION  2017   thigh surgery  left   Past Medical History:  Diagnosis Date   Clotting disorder (HCC)    Gout    Headache    Hypertension    Neck pain    Neuropathy    feet   Sleep apnea    Thyroid disease    BP 135/79   Pulse 83   Temp 98.2 F (36.8 C)   Ht 6\' 4"  (1.93 m)   Wt (!) 324 lb 12.8 oz (147.3 kg)   SpO2 95%   BMI 39.54 kg/m   Opioid Risk Score:   Fall Risk Score:  `1  Depression screen PHQ 2/9  Depression screen [facility] 2/9 11/14/2020 09/19/2020 07/31/2020 07/01/2020 06/03/2020  Decreased Interest 0 0 0 1 1  Down, Depressed,  Hopeless 0 0 - 1 1  PHQ - 2 Score 0 0 0 2 2  Altered sleeping - - - - 2  Tired, decreased energy - - - - 2  Change in appetite - - - - 2  Feeling bad or failure about yourself  - - - - 3  Trouble concentrating - - - - 1  Moving slowly or fidgety/restless - - - - 2  Suicidal thoughts - - - - 0  PHQ-9 Score - - - - 14     Review of Systems  Constitutional: Negative.   HENT: Negative.    Eyes: Negative.   Respiratory: Negative.    Cardiovascular: Negative.   Gastrointestinal: Negative.   Endocrine: Negative.   Musculoskeletal:  Positive for back pain.  Skin: Negative.   Allergic/Immunologic: Negative.   Neurological: Negative.   Hematological: Negative.   Psychiatric/Behavioral: Negative.    All other systems reviewed and are negative.     Objective:   Physical Exam Vitals and nursing note reviewed.  Constitutional:      Appearance: Normal appearance. He is obese.  HENT:     Head: Normocephalic and atraumatic.  Eyes:     Extraocular Movements: Extraocular movements intact.     Conjunctiva/sclera: Conjunctivae normal.     Pupils: Pupils are equal, round, and reactive to light.  Pulmonary:     Effort: No respiratory distress.  Musculoskeletal:        General: Deformity present.     Right lower leg: No edema.     Left lower leg: No edema.  Skin:    General: Skin is warm and dry.     Coloration: Skin is not jaundiced.     Findings: Lesion present.  Neurological:     Mental Status: He is oriented to person, place, and time. Mental status is at baseline.     Motor: No weakness.  Psychiatric:        Mood and Affect: Mood normal.        Behavior: Behavior normal.        Thought Content: Thought content normal.        Judgment: Judgment normal.          Assessment & Plan:   1.  Chronic pain left lateral hip due to excessive soft tissue injury status post multiple reconstructive surgeries with skin grafting.  Findings most consistent with meralgia paresthetica He  has  neuropathic pain well controlled with duloxetine 60 mg a day 2.  Chronic history of spasm post injury, control improved on tizanidine 2 mg 3 times daily 3.  Activity related pain post injury, start tramadol 100 mg nightly as needed Return to clinic 6 months CSA signed today Oral swab today if illicit drugs are present or nonprescribed opiates would cancel remaining tramadol prescriptions

## 2020-11-22 LAB — DRUG TOX MONITOR 1 W/CONF, ORAL FLD
Amphetamines: NEGATIVE ng/mL (ref <10)
Barbiturates: NEGATIVE ng/mL (ref <10)
Benzodiazepines: NEGATIVE ng/mL (ref <0.50)
Buprenorphine: NEGATIVE ng/mL (ref <0.10)
Cocaine: NEGATIVE ng/mL (ref <5.0)
Cotinine: >250 ng/mL — ABNORMAL HIGH (ref <5.0)
Fentanyl: NEGATIVE ng/mL (ref <0.10)
Heroin Metabolite: NEGATIVE ng/mL (ref <1.0)
MARIJUANA: NEGATIVE ng/mL (ref <2.5)
MDMA: NEGATIVE ng/mL (ref <10)
Meprobamate: NEGATIVE ng/mL (ref <2.5)
Methadone: NEGATIVE ng/mL (ref <5.0)
Nicotine Metabolite: POSITIVE ng/mL — AB (ref <5.0)
Opiates: NEGATIVE ng/mL (ref <2.5)
Phencyclidine: NEGATIVE ng/mL (ref <10)
Tapentadol: NEGATIVE ng/mL (ref <5.0)
Tramadol: NEGATIVE ng/mL (ref <5.0)
Zolpidem: NEGATIVE ng/mL (ref <5.0)

## 2020-11-22 LAB — DRUG TOX ALC METAB W/CON, ORAL FLD: Alcohol Metabolite: NEGATIVE ng/mL (ref <25)

## 2020-11-24 ENCOUNTER — Telehealth: Payer: Self-pay | Admitting: *Deleted

## 2020-11-24 NOTE — Telephone Encounter (Signed)
Oral swab was negative for medications unprescribed or illicit. No medications present.

## 2021-04-29 ENCOUNTER — Telehealth: Payer: Self-pay

## 2021-04-29 MED ORDER — TRAMADOL HCL 50 MG PO TABS: 100.0000 mg | ORAL_TABLET | Freq: Every evening | ORAL | 1 refills | Status: DC

## 2021-04-29 NOTE — Telephone Encounter (Signed)
Refilled enough to get to his appt in February and Mrs Morken notified.

## 2021-04-29 NOTE — Telephone Encounter (Signed)
Wife called stating patient needs a refill on his tramadol .  His next appointment is February 24th .

## 2021-05-19 ENCOUNTER — Ambulatory Visit: Payer: No Typology Code available for payment source | Admitting: Physical Medicine & Rehabilitation

## 2021-06-05 ENCOUNTER — Encounter
Payer: No Typology Code available for payment source | Attending: Physical Medicine & Rehabilitation | Admitting: Physical Medicine & Rehabilitation

## 2021-06-05 ENCOUNTER — Other Ambulatory Visit: Payer: Self-pay

## 2021-06-05 ENCOUNTER — Encounter: Payer: Self-pay | Admitting: Physical Medicine & Rehabilitation

## 2021-06-05 VITALS — BP 124/80 | HR 68 | Temp 97.9°F | Ht 74.0 in | Wt 315.0 lb

## 2021-06-05 DIAGNOSIS — Z5181 Encounter for therapeutic drug level monitoring: Secondary | ICD-10-CM | POA: Insufficient documentation

## 2021-06-05 DIAGNOSIS — Z79891 Long term (current) use of opiate analgesic: Secondary | ICD-10-CM | POA: Diagnosis present

## 2021-06-05 DIAGNOSIS — G894 Chronic pain syndrome: Secondary | ICD-10-CM | POA: Insufficient documentation

## 2021-06-05 MED ORDER — TIZANIDINE HCL 2 MG PO TABS: 4.0000 mg | ORAL_TABLET | Freq: Three times a day (TID) | ORAL | 5 refills | Status: DC | PRN

## 2021-06-05 MED ORDER — TRAMADOL HCL 50 MG PO TABS: 100.0000 mg | ORAL_TABLET | Freq: Every evening | ORAL | 5 refills | Status: DC

## 2021-06-05 NOTE — Progress Notes (Signed)
Subjective:    Patient ID: Scott Preston, male    DOB: 01/09/1961, 61 y.o.   MRN: [id number] rush injury at work resulting in [place] injury in May 2019 primary to left lateral hip with multiple infections and debridement, wound vac and eventually skin graft.  Patient also had a small area in the right posterior thigh.  Hospitalized and treated at [facility], treated and settled workers comp injury now seen at [place].  Walking and sitting affects the Left hip, walking affects the right posterior thigh.  The pain is described as tingly.  He has been treated with gabapentin 900 mg 3 times a day.  He notices when he does not take the medicine on time or skips a couple doses his pain increases significantly.  He has not been taking narcotic analgesics.  He has not tried any topical medications. The patient has returned to work and has a sedentary position.  He has gained at least 20 pounds post injury. HPI Patient returns today stating that overall the sharp burning pain in the lateral hip area has improved.  He has been on duloxetine 60 mg/day for over 2 months. His hamstring spasms have improved after switching from methocarbamol to tizanidine 2 mg 3 times per day The patient continues on gabapentin 900 mg 3 times daily prescribed by primary care physician His main complaint is activity related pain after work.  We discussed potential treatment options for this. The patient continues to work full-time in a sedentary position HPI   Pain Inventory Average Pain 2 Pain Right Now 2 My pain is constant, sharp, stabbing, and tingling  In the last 24 hours, has pain interfered with the following? General activity 2 Relation with others 2 Enjoyment of life 2 What TIME of day is your pain at its worst? daytime Sleep (in general) Fair  Pain is worse with: walking, bending, and some activites Pain improves with: rest and medication Relief from Meds: 2  Family History  Problem Relation Age of  Onset   Stroke Father    Pancreatic cancer Father    Social History   Socioeconomic History   Marital status: Married    Spouse name: Not on file   Number of children: 3   Years of education: Not on file   Highest education level: Not on file  Occupational History   Occupation: Scientist, research (medical)  Tobacco Use   Smoking status: Former    Packs/day: 0.50    Years: 2.00    Pack years: 1.00    Types: Cigarettes    Quit date: 04/03/1983    Years since quitting: 38.2   Smokeless tobacco: Current    Types: Chew  Vaping Use   Vaping Use: Never used  Substance and Sexual Activity   Alcohol use: Yes    Comment: occasionally beer   Drug use: Not on file   Sexual activity: Not on file  Other Topics Concern   Not on file  Social History Narrative   Not on file   Social Determinants of Health   Financial Resource Strain: Not on file  Food Insecurity: Not on file  Transportation Needs: Not on file  Physical Activity: Not on file  Stress: Not on file  Social Connections: Not on file   Past Surgical History:  Procedure Laterality Date   ABDOMINAL SURGERY     HERNIA REPAIR     KNEE SURGERY     LEG SURGERY  05/22/2018   skin graft   SPINAL  FUSION  2017   thigh surgery  left   Past Surgical History:  Procedure Laterality Date   ABDOMINAL SURGERY     HERNIA REPAIR     KNEE SURGERY     LEG SURGERY  05/22/2018   skin graft   SPINAL FUSION  2017   thigh surgery  left   Past Medical History:  Diagnosis Date   Clotting disorder (HCC)    Gout    Headache    Hypertension    Neck pain    Neuropathy    feet   Sleep apnea    Thyroid disease    There were no vitals taken for this visit.  Opioid Risk Score:   Fall Risk Score:  `1  Depression screen PHQ 2/9  Depression screen [facility] 2/9 11/14/2020 09/19/2020 07/31/2020 07/01/2020 06/03/2020  Decreased Interest 0 0 0 1 1  Down, Depressed, Hopeless 0 0 - 1 1  PHQ - 2 Score 0 0 0 2 2  Altered sleeping - - - - 2  Tired,  decreased energy - - - - 2  Change in appetite - - - - 2  Feeling bad or failure about yourself  - - - - 3  Trouble concentrating - - - - 1  Moving slowly or fidgety/restless - - - - 2  Suicidal thoughts - - - - 0  PHQ-9 Score - - - - 14    Review of Systems  Musculoskeletal:        Pain left hip & both upper thighs Right Knee pain  All other systems reviewed and are negative.     Objective:   Physical Exam Vitals and nursing note reviewed.  Constitutional:      Appearance: He is obese.  HENT:     Head: Normocephalic and atraumatic.  Eyes:     Extraocular Movements: Extraocular movements intact.     Conjunctiva/sclera: Conjunctivae normal.     Pupils: Pupils are equal, round, and reactive to light.  Skin:    General: Skin is warm and dry.     Comments: Skin grafting inferior and posterior to ASIS on the left side area of 10 cm x 7 cm mild sensitivity to touch also sensitive to touch over the lateral thigh  Neurological:     Mental Status: He is alert and oriented to person, place, and time.  Psychiatric:        Mood and Affect: Mood normal.        Behavior: Behavior normal.    Ilioinguinal area not sensitive + meralgia paresthetica      Assessment & Plan:   #1.  Meralgia paresthetica due to extensive soft tissue injury almost 4 years ago.  Anticipate need for long-term neuropathic pain medications Continue gabapentin 100 mg 3 times daily Continue duloxetine 60 mg/day Continue tramadol 100 mg q. noon Continue tizanidine 2 to 4 mg every 8 hours as needed for muscle spasms Physical medicine rehab follow-up in 6 months

## 2021-06-10 ENCOUNTER — Telehealth: Payer: Self-pay | Admitting: *Deleted

## 2021-06-10 LAB — TOXASSURE SELECT,+ANTIDEPR,UR

## 2021-06-10 NOTE — Telephone Encounter (Signed)
Urine drug screen for this encounter is consistent for prescribed medication 

## 2021-12-03 ENCOUNTER — Ambulatory Visit: Payer: No Typology Code available for payment source | Admitting: Physical Medicine & Rehabilitation

## 2021-12-03 ENCOUNTER — Encounter: Payer: Self-pay | Admitting: Physical Medicine & Rehabilitation

## 2021-12-03 ENCOUNTER — Encounter
Payer: No Typology Code available for payment source | Attending: Physical Medicine & Rehabilitation | Admitting: Physical Medicine & Rehabilitation

## 2021-12-03 VITALS — BP 134/88 | HR 76 | Ht 74.0 in | Wt 344.2 lb

## 2021-12-03 DIAGNOSIS — G588 Other specified mononeuropathies: Secondary | ICD-10-CM | POA: Insufficient documentation

## 2021-12-03 DIAGNOSIS — G5713 Meralgia paresthetica, bilateral lower limbs: Secondary | ICD-10-CM | POA: Diagnosis present

## 2021-12-03 MED ORDER — TRAMADOL HCL 50 MG PO TABS: 100.0000 mg | ORAL_TABLET | Freq: Every evening | ORAL | 5 refills | Status: DC

## 2021-12-03 MED ORDER — TIZANIDINE HCL 2 MG PO TABS: 4.0000 mg | ORAL_TABLET | Freq: Three times a day (TID) | ORAL | 5 refills | Status: DC | PRN

## 2021-12-03 NOTE — Patient Instructions (Addendum)
Increase tizanidine to 2 tabs 3 times a day , if no better will look at other options next month such as switch from gabapentin to prgabalin

## 2021-12-03 NOTE — Progress Notes (Signed)
Subjective:    Patient ID: Scott Preston, male    DOB: 28-Jul-1960, 61 y.o.   MRN: [id number]  HPI  61 year old male with history of remote degloving and crush injury at work mainly affecting the left lateral hip area.  He has undergone multiple plastic surgery procedures including split thickness skin grafting.  He has required multiple debridement procedures and has soft tissue deficit left lateral hip area.  He has chronic nerve pain related to lateral femoral cutaneous injury as well as probable iliohypogastric injury.  He is still able to perform his sedentary job.  He has noted some increased spasms in his hamstrings but states he is only taking tizanidine once a day rather than up to 3 times a day as prescribed.  He also has what sounds like nerve pain just inferior to the left lateral hip soft tissue defect Physical activity  Duloxetine 60mg   Tizanidine 4mg  in am  Tramadol 100mg   Gabapentin 900mg  6a,  noon,  6p Pain Inventory Average Pain 3 Pain Right Now 3 My pain is sharp, stabbing, and tingling  In the last 24 hours, has pain interfered with the following? General activity 3 Relation with others 1 Enjoyment of life 5 What TIME of day is your pain at its worst? evening and night Sleep (in general) Fair  Pain is worse with: bending and some activites Pain improves with: rest and medication Relief from Meds: 2  Family History  Problem Relation Age of Onset   Stroke Father    Pancreatic cancer Father    Social History   Socioeconomic History   Marital status: Married    Spouse name: Not on file   Number of children: 3   Years of education: Not on file   Highest education level: Not on file  Occupational History   Occupation:  Tobacco Use   Smoking status: Former    Packs/day: 0.50    Years: 2.00    Total pack years: 1.00    Types: Cigarettes    Quit date: 04/03/1983    Years since quitting: 38.6   Smokeless tobacco: Current    Types:  Chew  Vaping Use   Vaping Use: Never used  Substance and Sexual Activity   Alcohol use: Yes    Comment: occasionally beer   Drug use: Not on file   Sexual activity: Not on file  Other Topics Concern   Not on file  Social History Narrative   Not on file   Social Determinants of Health   Financial Resource Strain: Not on file  Food Insecurity: Not on file  Transportation Needs: Not on file  Physical Activity: Not on file  Stress: Not on file  Social Connections: Not on file   Past Surgical History:  Procedure Laterality Date   ABDOMINAL SURGERY     HERNIA REPAIR     KNEE SURGERY     LEG SURGERY  05/22/2018   skin graft   SPINAL FUSION  2017   thigh surgery  left   Past Surgical History:  Procedure Laterality Date   ABDOMINAL SURGERY     HERNIA REPAIR     KNEE SURGERY     LEG SURGERY  05/22/2018   skin graft   SPINAL FUSION  2017   thigh surgery  left   Past Medical History:  Diagnosis Date   Clotting disorder (HCC)    Gout    Headache    Hypertension    Neck pain  Neuropathy    feet   Sleep apnea    Thyroid disease    BP 134/88   Pulse 76   Ht 6\' 2"  (1.88 m)   Wt (!) 344 lb 3.2 oz (156.1 kg)   SpO2 94%   BMI 44.19 kg/m   Opioid Risk Score:   Fall Risk Score:  `1  Depression screen [facility] 2/9     12/03/2021    2:08 PM 06/05/2021    3:44 PM 11/14/2020    3:28 PM 09/19/2020    9:08 AM 07/31/2020    2:58 PM 07/01/2020    1:51 PM 06/03/2020    1:13 PM  Depression screen PHQ 2/9  Decreased Interest 0 0 0 0 0 1 1  Down, Depressed, Hopeless 0 0 0 0  1 1  PHQ - 2 Score 0 0 0 0 0 2 2  Altered sleeping       2  Tired, decreased energy       2  Change in appetite       2  Feeling bad or failure about yourself        3  Trouble concentrating       1  Moving slowly or fidgety/restless       2  Suicidal thoughts       0  PHQ-9 Score       14     Review of Systems  Constitutional: Negative.   HENT: Negative.    Eyes: Negative.   Respiratory:  Negative.    Cardiovascular: Negative.   Gastrointestinal: Negative.   Endocrine: Negative.   Genitourinary: Negative.   Musculoskeletal: Negative.   Skin: Negative.   Allergic/Immunologic: Negative.   Neurological: Negative.   Hematological: Negative.   Psychiatric/Behavioral: Negative.        Objective:   Physical Exam Vitals and nursing note reviewed.  Constitutional:      Appearance: He is obese.  HENT:     Head: Normocephalic and atraumatic.  Eyes:     Extraocular Movements: Extraocular movements intact.     Conjunctiva/sclera: Conjunctivae normal.     Pupils: Pupils are equal, round, and reactive to light.  Musculoskeletal:     Comments: No tenderness palpation over the left anterior or lateral thigh when palpated but in the area that is tender below the soft tissue deficit no fluctuance no erythema no induration  Neurological:     Mental Status: He is alert.     Comments: Reduced sensation left lateral hip Has normal strength left lower extremity Ambulates without assistive device no evidence of toe drag and instability  Psychiatric:        Mood and Affect: Mood normal.        Behavior: Behavior normal.           Assessment & Plan:  #1.  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  Overall functioning well has some suboptimal pain control.  Given his description of hamstring spasms will ask him to increase his tizanidine to 4 mg 3 times daily monitor for drowsiness.  In addition we will see him back in 1 month if no better we will likely transition off gabapentin and start some pregabalin.

## 2022-01-01 ENCOUNTER — Encounter: Payer: Self-pay | Admitting: Physical Medicine & Rehabilitation

## 2022-01-01 ENCOUNTER — Encounter
Payer: No Typology Code available for payment source | Attending: Physical Medicine & Rehabilitation | Admitting: Physical Medicine & Rehabilitation

## 2022-01-01 VITALS — BP 105/72 | HR 79 | Ht 74.0 in | Wt 344.0 lb

## 2022-01-01 DIAGNOSIS — G588 Other specified mononeuropathies: Secondary | ICD-10-CM | POA: Diagnosis present

## 2022-01-01 DIAGNOSIS — G5780 Other specified mononeuropathies of unspecified lower limb: Secondary | ICD-10-CM

## 2022-01-01 DIAGNOSIS — G5713 Meralgia paresthetica, bilateral lower limbs: Secondary | ICD-10-CM | POA: Diagnosis present

## 2022-01-01 NOTE — Patient Instructions (Addendum)
May take 2 tylenol with tramadol   Stretch Left hamstring and apply icy hot or similar 2 x per day

## 2022-01-01 NOTE — Progress Notes (Signed)
Subjective:    Patient ID: Scott Preston, male    DOB: Jan 11, 1961, 61 y.o.   MRN: [id number]  HPI 61 year old male with traumatic soft tissue injury left lower extremity resulting in left iliohypogastric nerve pain as well as lateral femoral cutaneous nerve pain.  He is doing better with his nerve pain this month he remains on gabapentin 900 mg 3 times daily prescribed by another physician He continues to have some hamstring spasms although a little bit better with 3 times daily tizanidine 4 mg.  We discussed stretching as well as topical counter irritant cream such as IcyHot usage.  He states that he has a variety of creams at home. He plans to follow-up with orthopedics for right knee pain.  He has a history of arthroscopy. Pain Inventory Average Pain 8 Pain Right Now 8 My pain is sharp, dull, and stabbing  In the last 24 hours, has pain interfered with the following? General activity 9 Relation with others 8 Enjoyment of life 9 What TIME of day is your pain at its worst? daytime, evening, and night Sleep (in general) Poor  Pain is worse with: walking, bending, and standing Pain improves with: rest Relief from Meds: 0  Family History  Problem Relation Age of Onset   Stroke Father    Pancreatic cancer Father    Social History   Socioeconomic History   Marital status: Married    Spouse name: Not on file   Number of children: 3   Years of education: Not on file   Highest education level: Not on file  Occupational History   Occupation: Contractor  Tobacco Use   Smoking status: Former    Packs/day: 0.50    Years: 2.00    Total pack years: 1.00    Types: Cigarettes    Quit date: 04/03/1983    Years since quitting: 38.7   Smokeless tobacco: Current    Types: Chew  Vaping Use   Vaping Use: Never used  Substance and Sexual Activity   Alcohol use: Yes    Comment: occasionally beer   Drug use: Not on file   Sexual activity: Not on file  Other Topics Concern    Not on file  Social History Narrative   Not on file   Social Determinants of Health   Financial Resource Strain: Not on file  Food Insecurity: Not on file  Transportation Needs: Not on file  Physical Activity: Not on file  Stress: Not on file  Social Connections: Not on file   Past Surgical History:  Procedure Laterality Date   [place]  05/22/2018   skin graft   SPINAL FUSION  2017   thigh surgery  left   Past Surgical History:  Procedure Laterality Date   ABDOMINAL SURGERY     HERNIA REPAIR     KNEE SURGERY     LEG SURGERY  05/22/2018   skin graft   SPINAL FUSION  2017   thigh surgery  left   Past Medical History:  Diagnosis Date   Clotting disorder (HCC)    Gout    Headache    Hypertension    Neck pain    Neuropathy    feet   Sleep apnea    Thyroid disease    BP 105/72   Pulse 79   Ht 6\' 2"  (1.88 m)   Wt (!) 344 lb (  156 kg)   SpO2 95%   BMI 44.17 kg/m   Opioid Risk Score:   Fall Risk Score:  `1  Depression screen [facility] 2/9     12/03/2021    2:08 PM 06/05/2021    3:44 PM 11/14/2020    3:28 PM 09/19/2020    9:08 AM 07/31/2020    2:58 PM 07/01/2020    1:51 PM 06/03/2020    1:13 PM  Depression screen PHQ 2/9  Decreased Interest 0 0 0 0 0 1 1  Down, Depressed, Hopeless 0 0 0 0  1 1  PHQ - 2 Score 0 0 0 0 0 2 2  Altered sleeping       2  Tired, decreased energy       2  Change in appetite       2  Feeling bad or failure about yourself        3  Trouble concentrating       1  Moving slowly or fidgety/restless       2  Suicidal thoughts       0  PHQ-9 Score       14     Review of Systems  Musculoskeletal:        Right hip, knee pain Hamstring pain   All other systems reviewed and are negative.     Objective:   Physical Exam Obese male in no acute distress Mood and affect are appropriate The patient has mild tenderness palpation along the posterior hamstring area on the left  side.  No tenderness along the posterior hip/gluteal area No pain with ambulation.  He does not use an assistive device.  He does have antalgic gait with reduced stance phase on the left side which is chronic.       Assessment & Plan:  #1.  Chronic iliohypogastric and lateral femoral cutaneous nerve pain on left side following traumatic soft tissue injury.  He has had multiple surgical procedures including skin grafting.  Continue gabapentin 900 mg 3 times daily 2.  Hamstring spasm may be compensatory has had increasing pain in the right knee and plans to follow-up with orthopedics.  He is generally not been bearing as much weight on the left side due to chronic pain on the left side.  Recommend hamstring stretching as well as topical cream. Physical medicine rehab follow-up in 4 months.  Continue tramadol 100 mg/day, if extra pain relief is needed he can take up to 2 Tylenol with tramadol.

## 2022-05-07 ENCOUNTER — Encounter: Payer: Self-pay | Admitting: Physical Medicine & Rehabilitation

## 2022-05-07 ENCOUNTER — Encounter
Payer: No Typology Code available for payment source | Attending: Physical Medicine & Rehabilitation | Admitting: Physical Medicine & Rehabilitation

## 2022-05-07 VITALS — BP 123/84 | HR 80 | Ht 74.0 in | Wt 350.0 lb

## 2022-05-07 DIAGNOSIS — Z5181 Encounter for therapeutic drug level monitoring: Secondary | ICD-10-CM | POA: Insufficient documentation

## 2022-05-07 DIAGNOSIS — Z79891 Long term (current) use of opiate analgesic: Secondary | ICD-10-CM | POA: Insufficient documentation

## 2022-05-07 DIAGNOSIS — G894 Chronic pain syndrome: Secondary | ICD-10-CM

## 2022-05-07 MED ORDER — TRAMADOL HCL 50 MG PO TABS: 100.0000 mg | ORAL_TABLET | Freq: Two times a day (BID) | ORAL | 2 refills | Status: DC

## 2022-05-07 NOTE — Progress Notes (Signed)
Subjective:    Patient ID: Scott Preston, male    DOB: 05-08-1960, 62 y.o.   MRN: [id number]  HPI 62 year old male with history of industrial injury pain for 7 minutes between a pipe over his left hip and a very heavy object on the right lateral hip.  He has had bilateral hip numbness since that time as well as burning pain in the left hip area. The patient also had knee injury and is treated by orthopedics.  He thinks he may be undergoing total knee replacement in the near future. 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 He complains of increasing pain in the anterior thighs.  He denies any increased back pain. He has a history of cervical fusion but no history of lumbar pain His medications include tramadol 100 mg at noon every day Neurontin 900 mg 3 times daily Tizanidine 2-4 mg every 8 hours as needed Pain Inventory Average Pain 4 Pain Right Now 4 My pain is constant, sharp, and dull  In the last 24 hours, has pain interfered with the following? General activity 4 Relation with others 5 Enjoyment of life 8 What TIME of day is your pain at its worst? evening Sleep (in general) Fair  Pain is worse with: walking Pain improves with: rest and medication Relief from Meds: 2  Family History  Problem Relation Age of Onset   Stroke Father    Pancreatic cancer Father    Social History   Socioeconomic History   Marital status: Married    Spouse name: Not on file   Number of children: 3   Years of education: Not on file   Highest education level: Not on file  Occupational History   Occupation: Contractor  Tobacco Use   Smoking status: Former    Packs/day: 0.50    Years: 2.00    Total pack years: 1.00    Types: Cigarettes    Quit date: 04/03/1983    Years since quitting: 39.1   Smokeless tobacco: Current    Types: Chew  Vaping Use    Vaping Use: Never used  Substance and Sexual Activity   Alcohol use: Yes    Comment: occasionally beer   Drug use: Not on file   Sexual activity: Not on file  Other Topics Concern   Not on file  Social History Narrative   Not on file   Social Determinants of Health   Financial Resource Strain: Not on file  Food Insecurity: Not on file  Transportation Needs: Not on file  Physical Activity: Not on file  Stress: Not on file  Social Connections: Not on file   Past Surgical History:  Procedure Laterality Date   [place]  05/22/2018   skin graft   SPINAL FUSION  2017   thigh surgery  left   Past Surgical History:  Procedure Laterality Date   ABDOMINAL SURGERY     HERNIA REPAIR     KNEE SURGERY     LEG SURGERY  05/22/2018   skin graft   SPINAL FUSION  2017   thigh surgery  left   Past Medical History:  Diagnosis Date   Clotting disorder ([place])    Gout    Headache    Hypertension  Neck pain    Neuropathy    feet   Sleep apnea    Thyroid disease    There were no vitals taken for this visit.  Opioid Risk Score:   Fall Risk Score:  `1  Depression screen [facility] 2/9     12/03/2021    2:08 PM 06/05/2021    3:44 PM 11/14/2020    3:28 PM 09/19/2020    9:08 AM 07/31/2020    2:58 PM 07/01/2020    1:51 PM 06/03/2020    1:13 PM  Depression screen PHQ 2/9  Decreased Interest 0 0 0 0 0 1 1  Down, Depressed, Hopeless 0 0 0 0  1 1  PHQ - 2 Score 0 0 0 0 0 2 2  Altered sleeping       2  Tired, decreased energy       2  Change in appetite       2  Feeling bad or failure about yourself        3  Trouble concentrating       1  Moving slowly or fidgety/restless       2  Suicidal thoughts       0  PHQ-9 Score       14    Review of Systems  Musculoskeletal:        Pain in both knees, both legs, hips down to both feet  All other systems reviewed and are negative.      Objective:   Physical Exam  Obese male  no acute distress Mood and affect are appropriate He has well-healed skin graft over the left anterior lateral hip just inferior to ASIS. Lumbar spine has no point tenderness palpation no skin abrasions Lumbar range of motion 50% forward flexion limited by body habitus extension around 50% as well no pain with range of motion. Sensation reduced lateral thigh bilaterally no sensory loss in the anterior thigh or in the medial thigh. Motor strength is 5/5 bilateral hip flexor knee extensor ankle dorsiflexor Ambulates without assistive device no evidence of toe drag and instability      Assessment & Plan:   1.  Bilateral posttraumatic pain meralgia paresthetica worse on the left side.  Continue gabapentin 900 mg 3 times daily prescribed by another doctor continue duloxetine 60 mg daily, prescribed at this clinic Increase tramadol to 100 mg twice daily, feel that increasing pain may be due to tolerance UDS today Recheck in 3 months Long discussion with patient and his wife going over the anatomy of his injury

## 2022-05-12 LAB — TOXASSURE SELECT,+ANTIDEPR,UR

## 2022-08-06 ENCOUNTER — Encounter: Payer: Self-pay | Admitting: Physical Medicine & Rehabilitation

## 2022-08-06 ENCOUNTER — Encounter
Payer: No Typology Code available for payment source | Attending: Physical Medicine & Rehabilitation | Admitting: Physical Medicine & Rehabilitation

## 2022-08-06 VITALS — BP 125/82 | HR 73 | Ht 74.0 in | Wt 358.0 lb

## 2022-08-06 DIAGNOSIS — G5780 Other specified mononeuropathies of unspecified lower limb: Secondary | ICD-10-CM | POA: Insufficient documentation

## 2022-08-06 DIAGNOSIS — G5713 Meralgia paresthetica, bilateral lower limbs: Secondary | ICD-10-CM | POA: Diagnosis not present

## 2022-08-06 MED ORDER — TIZANIDINE HCL 2 MG PO TABS: 4.0000 mg | ORAL_TABLET | Freq: Three times a day (TID) | ORAL | 5 refills | Status: AC | PRN

## 2022-08-06 MED ORDER — TRAMADOL HCL 50 MG PO TABS: 100.0000 mg | ORAL_TABLET | Freq: Two times a day (BID) | ORAL | 5 refills | Status: AC

## 2022-08-06 NOTE — Progress Notes (Signed)
Subjective:    Patient ID: Scott Preston, male    DOB: November 08, 1960, 62 y.o.   MRN: [id number]  HPI 62 year old male with history of work injury involving pelvic crush injury as well as extensive soft tissue loss in the left lateral hip area.  He has neuropathic pain involving the left iliohypogastric nerve as well as left greater than right lateral femoral cutaneous nerve.  He has been taking high-dose gabapentin 900 mg 3 times daily in addition to duloxetine 60 mg/day.  In addition at last visit because of uncontrolled pain tramadol dose was increased from 100 mg/day to 100 mg twice daily.  He is not complaining of any sweating or increased anxiety 06/28/2022 Right knee menisctomy  Took some post op pain meds but did not take tramadol during that time Did post op PT back at work ~2wks post op  Pain Inventory Average Pain 4 Pain Right Now 3 My pain is dull, tingling, and aching  In the last 24 hours, has pain interfered with the following? General activity 4 Relation with others 4 Enjoyment of life 4 What TIME of day is your pain at its worst? daytime Sleep (in general) Fair  Pain is worse with: bending and some activites Pain improves with: rest, heat/ice, and medication Relief from Meds: 3  Family History  Problem Relation Age of Onset   Stroke Father    Pancreatic cancer Father    Social History   Socioeconomic History   Marital status: Married    Spouse name: Not on file   Number of children: 3   Years of education: Not on file   Highest education level: Not on file  Occupational History   Occupation: Scientist, research (medical)  Tobacco Use   Smoking status: Former    Packs/day: 0.50    Years: 2.00    Additional pack years: 0.00    Total pack years: 1.00    Types: Cigarettes    Quit date: 04/03/1983    Years since quitting: 39.3   Smokeless tobacco: Current    Types: Chew  Vaping Use   Vaping Use: Never used  Substance and Sexual Activity   Alcohol use: Yes     Comment: occasionally beer   Drug use: Not on file   Sexual activity: Not on file  Other Topics Concern   Not on file  Social History Narrative   Not on file   Social Determinants of Health   Financial Resource Strain: Not on file  Food Insecurity: Not on file  Transportation Needs: Not on file  Physical Activity: Not on file  Stress: Not on file  Social Connections: Not on file   Past Surgical History:  Procedure Laterality Date   ABDOMINAL SURGERY     HERNIA REPAIR     KNEE SURGERY     LEG SURGERY  05/22/2018   skin graft   SPINAL FUSION  2017   thigh surgery  left   Past Surgical History:  Procedure Laterality Date   ABDOMINAL SURGERY     HERNIA REPAIR     KNEE SURGERY     LEG SURGERY  05/22/2018   skin graft   SPINAL FUSION  2017   thigh surgery  left   Past Medical History:  Diagnosis Date   Clotting disorder ([facility])    Gout    Headache    Hypertension    Neck pain    Neuropathy    feet   Sleep apnea    Thyroid disease  BP 125/82   Pulse 73   Ht 6\' 2"  (1.88 m)   Wt (!) 358 lb (162.4 kg)   SpO2 96%   BMI 45.96 kg/m   Opioid Risk Score:   Fall Risk Score:  `1  Depression screen [facility] 2/9     08/06/2022    3:37 PM 05/07/2022    2:03 PM 12/03/2021    2:08 PM 06/05/2021    3:44 PM 11/14/2020    3:28 PM 09/19/2020    9:08 AM 07/31/2020    2:58 PM  Depression screen PHQ 2/9  Decreased Interest 0 0 0 0 0 0 0  Down, Depressed, Hopeless 0 0 0 0 0 0   PHQ - 2 Score 0 0 0 0 0 0 0      Review of Systems  Musculoskeletal:  Positive for gait problem.       Left hip pain  All other systems reviewed and are negative.     Objective:   Physical Exam  Patient has 50% range with lumbar flexion extension lateral bending.  His lower extremity strength is 5/5 Knee range of motion is normal.  Ambulates without assist device no evidence of toe drag and instability General no acute distress Mood and affect are appropriate      Assessment & Plan:   1.   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  Do not expect any significant recovery at this point.  Continue with current pain medications including tramadol 100 mg twice daily, gabapentin 900 mg 3 times daily, duloxetine 60 mg p.o. daily. Physical medicine rehab follow-up in 6 months.  Will need urine toxicology screen at next visit.

## 2023-02-04 ENCOUNTER — Encounter: Payer: No Typology Code available for payment source | Admitting: Physical Medicine & Rehabilitation
